# Patient Record
Sex: Female | Born: 1955 | Race: White | Hispanic: No | Marital: Married | Smoking: Never smoker
Health system: Southern US, Community
[De-identification: ages and names within clinical notes are randomized; demographics above are authoritative.]

## PROBLEM LIST (undated history)

## (undated) DIAGNOSIS — F419 Anxiety disorder, unspecified: Secondary | ICD-10-CM

## (undated) DIAGNOSIS — N390 Urinary tract infection, site not specified: Secondary | ICD-10-CM

## (undated) DIAGNOSIS — I10 Essential (primary) hypertension: Secondary | ICD-10-CM

## (undated) DIAGNOSIS — G44219 Episodic tension-type headache, not intractable: Secondary | ICD-10-CM

## (undated) DIAGNOSIS — M169 Osteoarthritis of hip, unspecified: Secondary | ICD-10-CM

## (undated) HISTORY — PX: APPENDECTOMY: SHX54

## (undated) HISTORY — DX: Osteoarthritis of hip, unspecified: M16.9

## (undated) HISTORY — DX: Episodic tension-type headache, not intractable: G44.219

## (undated) HISTORY — PX: BREAST CYST ASPIRATION: SHX578

## (undated) HISTORY — DX: Essential (primary) hypertension: I10

## (undated) HISTORY — DX: Urinary tract infection, site not specified: N39.0

## (undated) HISTORY — DX: Anxiety disorder, unspecified: F41.9

---
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
## 2007-03-27 ENCOUNTER — Ambulatory Visit: Payer: Self-pay | Admitting: Obstetrics and Gynecology

## 2007-05-07 ENCOUNTER — Ambulatory Visit: Payer: Self-pay | Admitting: Gastroenterology

## 2007-05-28 ENCOUNTER — Other Ambulatory Visit: Payer: Self-pay

## 2007-05-28 ENCOUNTER — Inpatient Hospital Stay: Payer: Self-pay | Admitting: Internal Medicine

## 2007-05-29 ENCOUNTER — Other Ambulatory Visit: Payer: Self-pay

## 2007-07-05 LAB — HM COLONOSCOPY: HM Colonoscopy: NORMAL

## 2007-12-19 ENCOUNTER — Ambulatory Visit: Payer: Self-pay | Admitting: Obstetrics and Gynecology

## 2008-05-07 ENCOUNTER — Ambulatory Visit: Payer: Self-pay | Admitting: Obstetrics and Gynecology

## 2009-11-28 HISTORY — PX: BREAST CYST ASPIRATION: SHX578

## 2010-07-04 LAB — HM MAMMOGRAPHY: HM Mammogram: NORMAL

## 2010-09-01 ENCOUNTER — Ambulatory Visit: Payer: Self-pay | Admitting: Internal Medicine

## 2012-05-07 ENCOUNTER — Emergency Department: Payer: Self-pay | Admitting: Emergency Medicine

## 2012-07-04 ENCOUNTER — Other Ambulatory Visit (HOSPITAL_COMMUNITY)
Admission: RE | Admit: 2012-07-04 | Discharge: 2012-07-04 | Disposition: A | Discharge status: Home / Self Care | Payer: PRIVATE HEALTH INSURANCE | Source: Ambulatory Visit | Attending: Internal Medicine | Admitting: Internal Medicine

## 2012-07-04 ENCOUNTER — Ambulatory Visit (INDEPENDENT_AMBULATORY_CARE_PROVIDER_SITE_OTHER): Payer: PRIVATE HEALTH INSURANCE | Admitting: Internal Medicine

## 2012-07-04 ENCOUNTER — Ambulatory Visit: Payer: Self-pay | Admitting: Internal Medicine

## 2012-07-04 ENCOUNTER — Telehealth: Payer: Self-pay | Admitting: Internal Medicine

## 2012-07-04 ENCOUNTER — Encounter: Payer: Self-pay | Admitting: Internal Medicine

## 2012-07-04 VITALS — BP 138/70 | HR 90 | Temp 98.0°F | Resp 16 | Ht 62.75 in | Wt 200.2 lb

## 2012-07-04 DIAGNOSIS — Z0000 Encounter for general adult medical examination without abnormal findings: Secondary | ICD-10-CM

## 2012-07-04 DIAGNOSIS — G44219 Episodic tension-type headache, not intractable: Secondary | ICD-10-CM

## 2012-07-04 DIAGNOSIS — Z124 Encounter for screening for malignant neoplasm of cervix: Secondary | ICD-10-CM

## 2012-07-04 DIAGNOSIS — M25522 Pain in left elbow: Secondary | ICD-10-CM

## 2012-07-04 DIAGNOSIS — Z01419 Encounter for gynecological examination (general) (routine) without abnormal findings: Secondary | ICD-10-CM | POA: Insufficient documentation

## 2012-07-04 DIAGNOSIS — M25529 Pain in unspecified elbow: Secondary | ICD-10-CM

## 2012-07-04 DIAGNOSIS — Z1151 Encounter for screening for human papillomavirus (HPV): Secondary | ICD-10-CM | POA: Insufficient documentation

## 2012-07-04 DIAGNOSIS — N390 Urinary tract infection, site not specified: Secondary | ICD-10-CM

## 2012-07-04 DIAGNOSIS — R030 Elevated blood-pressure reading, without diagnosis of hypertension: Secondary | ICD-10-CM

## 2012-07-04 DIAGNOSIS — I10 Essential (primary) hypertension: Secondary | ICD-10-CM

## 2012-07-04 LAB — MICROALBUMIN / CREATININE URINE RATIO: Microalb, Ur: 0.6 mg/dL (ref 0.0–1.9)

## 2012-07-04 NOTE — Patient Instructions (Addendum)
Return for fasting labs (make appt  With front desk )   Tonya Hopkins will try to get our ultrasound scheduled for this morning.  We will call you with the results of the labs you will get done

## 2012-07-04 NOTE — Progress Notes (Signed)
Patient ID: ARLAYNE LIGGINS, female   DOB: Jul 27, 1956, 56 y.o.   MRN: [id number]  Patient Active Problem List  Diagnosis  . Hypertension  . Recurrent urinary tract infection  . Headache, frequent episodic tension-type  . Pain, joint, upper arm, left    Subjective:  CC:   Chief Complaint  Patient presents with  . New Patient    HPI:   TYTIANA COLES is a 56 y.o. female who presents as a new patient to establish primary care with the chief complaint of Left elbow pain x 1 month,  No history of trauma but carries grocery bags with that arm.  Has tried OTC NSAIDs including alleve helps for a little while.  She takes it once daily at bedtime. She has no history of bruising or bleeding but notes that her left arm is larger from the elbow to the wrist compared to right.  No history of DVT .  No recent iv or blood draws. Her last episode of medical care was treatment for a case of shingles involving toes and dorsum of left foot which occurred in  June.  Very painful for 2 to 3 weeks,  Still has numbness in toes.  She is also requesting that her annual Pap smear were done today.   Past Medical History  Diagnosis Date  . Hypertension     without treatment  . Recurrent urinary tract infection   . Headache, frequent episodic tension-type     Past Surgical History  Procedure Date  . Appendectomy   . Tonsillectomy and adenoidectomy 1971    Family History  Problem Relation Age of Onset  . Osteoporosis Mother   . Heart disease Father   . Hypertension Father   . Osteoporosis Sister   . Hypertension Brother   . Diabetes Brother   . Stroke Brother 64    History   Social History  . Marital Status: Married    Spouse Name: N/A    Number of Children: N/A  . Years of Education: N/A   Occupational History  . Not on file.   Social History Main Topics  . Smoking status: Never Smoker   . Smokeless tobacco: Never Used  . Alcohol Use: No  . Drug Use: No  . Sexually Active: Not on file    Other Topics Concern  . Not on file   Social History Narrative  . No narrative on file    No Known Allergies   Review of Systems:  Review of Systems  Constitutional: Negative.   Eyes: Negative.   Respiratory: Negative.   Cardiovascular: Negative.   Gastrointestinal: Negative.   Genitourinary: Negative.   Musculoskeletal: Positive for joint pain.  Skin: Negative.   Neurological: Negative.  Negative for headaches.  Endo/Heme/Allergies: Negative.   Psychiatric/Behavioral: Negative.   A comprehensive ROS was done and positive for joint pain .   The rest was negative.    Objective:  BP 138/70  Pulse 90  Temp 98 F (36.7 C) (Oral)  Resp 16  Ht 5' 2.75" (1.594 m)  Wt 200 lb 4 oz (90.833 kg)  BMI 35.76 kg/m2  SpO2 96%  General appearance: alert, cooperative and appears stated age Ears: normal TM's and external ear canals both ears Throat: lips, mucosa, and tongue normal; teeth and gums normal Neck: no adenopathy, no carotid bruit, supple, symmetrical, trachea midline and thyroid not enlarged, symmetric, no tenderness/mass/nodules Back: symmetric, no curvature. ROM normal. No CVA tenderness. Lungs: clear to auscultation bilaterally Heart: regular  rate and rhythm, S1, S2 normal, no murmur, click, rub or gallop Abdomen: soft, non-tender; bowel sounds normal; no masses,  no organomegaly Pulses: 2+ and symmetric Skin: Skin color, texture, turgor normal. No rashes or lesions Lymph nodes: Cervical, supraclavicular, and axillary nodes normal.  Assessment and Plan:   Headache, frequent episodic tension-type S. When necessary Aleve.  Recurrent urinary tract infection She has not had a UTI in the past year.  Hypertension Her home blood pressures pressures are borderline. I have ordered a urinary microalbumin to creatinine ratio which was normal. Repeat blood pressure today was confirmed and normal. No medications needed.  Pain, joint, upper arm, left I sent her for  an ultrasound of the left arm to rule out a DVT given the asymmetry noted on exam. It was negative. Her pain appears to be coming from mild tendinitis caused by overdistention of the arm from carrying heavy groceries.. I recommended increasing the Aleve to twice daily. I have recommended that she switch arms and possibly use a cart to carry her groceries behind her.  Screening for cervical cancer Pap smear was done today. Her last one was in 2011 and normal. Pelvic exam was normal.  Routine general medical examination at a health care facility Patient requested annual exam to be good to be done today as she has no chronic illnesses that require followup.   Updated Medication List Outpatient Encounter Prescriptions as of 07/04/2012  Medication Sig Dispense Refill  . Naproxen Sodium (ALEVE PO) Take by mouth.

## 2012-07-04 NOTE — Telephone Encounter (Signed)
Ultrasound indicated negative DVT in patients left arm.

## 2012-07-05 ENCOUNTER — Encounter: Payer: Self-pay | Admitting: Internal Medicine

## 2012-07-05 DIAGNOSIS — Z0000 Encounter for general adult medical examination without abnormal findings: Secondary | ICD-10-CM | POA: Insufficient documentation

## 2012-07-05 DIAGNOSIS — G44219 Episodic tension-type headache, not intractable: Secondary | ICD-10-CM | POA: Insufficient documentation

## 2012-07-05 DIAGNOSIS — N390 Urinary tract infection, site not specified: Secondary | ICD-10-CM | POA: Insufficient documentation

## 2012-07-05 DIAGNOSIS — Z124 Encounter for screening for malignant neoplasm of cervix: Secondary | ICD-10-CM | POA: Insufficient documentation

## 2012-07-05 DIAGNOSIS — I10 Essential (primary) hypertension: Secondary | ICD-10-CM | POA: Insufficient documentation

## 2012-07-05 DIAGNOSIS — M25522 Pain in left elbow: Secondary | ICD-10-CM | POA: Insufficient documentation

## 2012-07-05 NOTE — Assessment & Plan Note (Signed)
Pap smear was done today. Her last one was in 2011 and normal. Pelvic exam was normal.

## 2012-07-05 NOTE — Assessment & Plan Note (Signed)
I sent her for an ultrasound of the left arm to rule out a DVT given the asymmetry noted on exam. It was negative. Her pain appears to be coming from mild tendinitis caused by overdistention of the arm from carrying heavy groceries.. I recommended increasing the Aleve to twice daily. I have recommended that she switch arms and possibly use a cart to carry her groceries behind her.

## 2012-07-05 NOTE — Assessment & Plan Note (Signed)
She has not had a UTI in the past year.

## 2012-07-05 NOTE — Assessment & Plan Note (Addendum)
Her home blood pressures pressures are borderline. I have ordered a urinary microalbumin to creatinine ratio which was normal. Repeat blood pressure today was confirmed and normal. No medications needed.

## 2012-07-05 NOTE — Assessment & Plan Note (Signed)
Patient requested annual exam to be good to be done today as she has no chronic illnesses that require followup.

## 2012-07-05 NOTE — Assessment & Plan Note (Signed)
S. When necessary Aleve.

## 2012-07-20 ENCOUNTER — Encounter: Payer: Self-pay | Admitting: Internal Medicine

## 2012-07-20 ENCOUNTER — Other Ambulatory Visit: Payer: PRIVATE HEALTH INSURANCE

## 2012-07-24 ENCOUNTER — Telehealth: Payer: Self-pay | Admitting: *Deleted

## 2012-07-24 ENCOUNTER — Other Ambulatory Visit (INDEPENDENT_AMBULATORY_CARE_PROVIDER_SITE_OTHER): Payer: PRIVATE HEALTH INSURANCE

## 2012-07-24 DIAGNOSIS — Z0000 Encounter for general adult medical examination without abnormal findings: Secondary | ICD-10-CM

## 2012-07-24 LAB — CBC WITH DIFFERENTIAL/PLATELET
Eosinophils Relative: 1.3 % (ref 0.0–5.0)
HCT: 44.3 % (ref 36.0–46.0)
Hemoglobin: 14.8 g/dL (ref 12.0–15.0)
Lymphocytes Relative: 34.8 % (ref 12.0–46.0)
Lymphs Abs: 1.9 10*3/uL (ref 0.7–4.0)
Monocytes Relative: 6.5 % (ref 3.0–12.0)
Neutro Abs: 3 10*3/uL (ref 1.4–7.7)
Platelets: 179 10*3/uL (ref 150.0–400.0)
WBC: 5.3 10*3/uL (ref 4.5–10.5)

## 2012-07-24 LAB — COMPREHENSIVE METABOLIC PANEL
Alkaline Phosphatase: 46 U/L (ref 39–117)
BUN: 17 mg/dL (ref 6–23)
Creatinine, Ser: 0.9 mg/dL (ref 0.4–1.2)
Glucose, Bld: 85 mg/dL (ref 70–99)
Total Bilirubin: 0.8 mg/dL (ref 0.3–1.2)

## 2012-07-24 LAB — LIPID PANEL
Cholesterol: 201 mg/dL — ABNORMAL HIGH (ref 0–200)
HDL: 52.9 mg/dL (ref >39.00)
Total CHOL/HDL Ratio: 4
VLDL: 19.2 mg/dL (ref 0.0–40.0)

## 2012-07-24 NOTE — Telephone Encounter (Signed)
Fasting lipids,  TSH,  CMET and CBC w diff

## 2012-07-24 NOTE — Telephone Encounter (Signed)
Patient came in for labs today, what would you like ordered?

## 2012-07-24 NOTE — Telephone Encounter (Signed)
Labs ordered.

## 2012-09-07 ENCOUNTER — Telehealth: Payer: Self-pay | Admitting: Internal Medicine

## 2012-09-07 DIAGNOSIS — Z1239 Encounter for other screening for malignant neoplasm of breast: Secondary | ICD-10-CM

## 2012-09-07 NOTE — Telephone Encounter (Signed)
Patient is aware of mammogram appointment at [facility] on October 29,2013 @ 6:00 pm.  I need an order to fax to [place].

## 2012-09-10 NOTE — Telephone Encounter (Signed)
Ordered and printed 

## 2012-09-10 NOTE — Telephone Encounter (Signed)
Can we please get an order. Thank you

## 2012-09-11 NOTE — Telephone Encounter (Signed)
I have printed this order and waiting for Dr.Tullo to sign so I can fax.

## 2012-09-14 NOTE — Telephone Encounter (Signed)
This was faxed on 09/10/12 and in blue folder for one month.

## 2012-09-25 ENCOUNTER — Ambulatory Visit: Payer: Self-pay | Admitting: Internal Medicine

## 2012-10-05 ENCOUNTER — Encounter: Payer: Self-pay | Admitting: Internal Medicine

## 2012-11-14 ENCOUNTER — Encounter: Payer: Self-pay | Admitting: Internal Medicine

## 2012-11-14 ENCOUNTER — Telehealth: Payer: Self-pay | Admitting: Internal Medicine

## 2012-11-14 ENCOUNTER — Ambulatory Visit (INDEPENDENT_AMBULATORY_CARE_PROVIDER_SITE_OTHER): Payer: PRIVATE HEALTH INSURANCE | Admitting: Internal Medicine

## 2012-11-14 VITALS — BP 130/90 | HR 89 | Temp 98.1°F | Resp 16 | Wt 204.2 lb

## 2012-11-14 DIAGNOSIS — J40 Bronchitis, not specified as acute or chronic: Secondary | ICD-10-CM

## 2012-11-14 DIAGNOSIS — H6690 Otitis media, unspecified, unspecified ear: Secondary | ICD-10-CM

## 2012-11-14 DIAGNOSIS — H6691 Otitis media, unspecified, right ear: Secondary | ICD-10-CM | POA: Insufficient documentation

## 2012-11-14 MED ORDER — BENZONATATE 200 MG PO CAPS: 200.0000 mg | ORAL_CAPSULE | Freq: Two times a day (BID) | ORAL | Status: DC | PRN

## 2012-11-14 MED ORDER — HYDROCOD POLST-CHLORPHEN POLST 10-8 MG/5ML PO LQCR: 5.0000 mL | Freq: Two times a day (BID) | ORAL | Status: DC | PRN

## 2012-11-14 MED ORDER — AZITHROMYCIN 250 MG PO TABS: ORAL_TABLET | ORAL | Status: DC

## 2012-11-14 NOTE — Assessment & Plan Note (Signed)
Symptoms are most consistent with bronchitis. Will treat with azithromycin. Will use Tessalon for cough during the day and Tussionex at night. Patient will call if symptoms are persistent or worsening of the next 48 hours. We discussed, however, that she may have some lingering dry cough over the next few weeks. She will e-mail with an update later this week.

## 2012-11-14 NOTE — Progress Notes (Signed)
Subjective:    Patient ID: Tonya Hopkins, female    DOB: Apr 19, 1956, 56 y.o.   MRN: [id number]  HPI 66 rolled female with history of hypertension presents for acute visit complaining of one-week history of cough, hoarseness, nasal congestion, postnasal drip, ear pain. She has some productive cough with yellow, occasionally blood-tinged sputum. She has also noticed some bleeding from her right nostril. She denies any fever or chills. She occasionally has some mild shortness of breath. She denies chest pain. She has been taking Robitussin-DM with minimal improvement in her symptoms.  Outpatient Encounter Prescriptions as of 11/14/2012  Medication Sig Dispense Refill  . Naproxen Sodium (ALEVE PO) Take by mouth.      Marland Kitchen azithromycin (ZITHROMAX Z-PAK) 250 MG tablet Take 2 pills day 1, then 1 pill daily days 2-5  6 each  0  . benzonatate (TESSALON) 200 MG capsule Take 1 capsule (200 mg total) by mouth 2 (two) times daily as needed for cough.  20 capsule  0  . chlorpheniramine-HYDROcodone (TUSSIONEX) 10-8 MG/5ML LQCR Take 5 mLs by mouth every 12 (twelve) hours as needed.  140 mL  0   BP 130/90  Pulse 89  Temp 98.1 F (36.7 C) (Oral)  Resp 16  Wt 204 lb 4 oz (92.647 kg)  SpO2 97%  Review of Systems  Constitutional: Positive for fatigue. Negative for fever, chills, appetite change and unexpected weight change.  HENT: Positive for ear pain, congestion, sore throat, rhinorrhea, voice change and postnasal drip. Negative for trouble swallowing, neck pain and sinus pressure.   Eyes: Negative for visual disturbance.  Respiratory: Positive for cough and shortness of breath. Negative for wheezing and stridor.   Cardiovascular: Negative for chest pain, palpitations and leg swelling.  Gastrointestinal: Negative for nausea, vomiting, abdominal pain, diarrhea, constipation, blood in stool, abdominal distention and anal bleeding.  Genitourinary: Negative for dysuria and flank pain.  Musculoskeletal: Negative  for myalgias, arthralgias and gait problem.  Skin: Negative for color change and rash.  Neurological: Negative for dizziness and headaches.  Hematological: Negative for adenopathy. Does not bruise/bleed easily.  Psychiatric/Behavioral: Negative for suicidal ideas, sleep disturbance and dysphoric mood. The patient is not nervous/anxious.        Objective:   Physical Exam  Constitutional: She is oriented to person, place, and time. She appears well-developed and well-nourished. No distress.  HENT:  Head: Normocephalic and atraumatic.  Right Ear: External ear normal. Tympanic membrane is erythematous and bulging. A middle ear effusion is present.  Left Ear: Tympanic membrane, external ear and ear canal normal.  Nose: Nose normal.  Mouth/Throat: Posterior oropharyngeal erythema present. No oropharyngeal exudate.  Eyes: Conjunctivae normal are normal. Pupils are equal, round, and reactive to light. Right eye exhibits no discharge. Left eye exhibits no discharge. No scleral icterus.  Neck: Normal range of motion. Neck supple. No tracheal deviation present. No thyromegaly present.  Cardiovascular: Normal rate, regular rhythm, normal heart sounds and intact distal pulses.  Exam reveals no gallop and no friction rub.   No murmur heard. Pulmonary/Chest: Effort normal. No accessory muscle usage. Not tachypneic. No respiratory distress. She has no decreased breath sounds. She has no wheezes. Rhonchi: scattered. She has no rales. She exhibits no tenderness.  Musculoskeletal: Normal range of motion. She exhibits no edema and no tenderness.  Lymphadenopathy:    She has no cervical adenopathy.  Neurological: She is alert and oriented to person, place, and time. No cranial nerve deficit. She exhibits normal muscle tone. Coordination normal.  Skin: Skin is warm and dry. No rash noted. She is not diaphoretic. No erythema. No pallor.  Psychiatric: She has a normal mood and affect. Her behavior is normal.  Judgment and thought content normal.          Assessment & Plan:

## 2012-11-14 NOTE — Telephone Encounter (Signed)
Patient Information:  Caller Name: Alexiah  Phone: [phone]  Patient: Tonya Hopkins, Meriweather  Gender: Female  DOB: Feb 25, 1956  Age: 56 Years  PCP: Duncan Dull (Adults only)  Office Follow Up:  Does the office need to follow up with this patient?: No  Instructions For The Office: N/A  RN Note:  States she is having severe coughing spells.  Denies difficulty breathing or wheezing.  Symptoms  Reason For Call & Symptoms: Cough, right earache  Reviewed Health History In EMR: Yes  Reviewed Medications In EMR: Yes  Reviewed Allergies In EMR: Yes  Reviewed Surgeries / Procedures: Yes  Date of Onset of Symptoms: 11/08/2012  Treatments Tried: Alka Seltzer Cold Plus, Robitussin DM  Treatments Tried Worked: Yes  Guideline(s) Used:  Cough  Disposition Per Guideline:   See Today in Office  Reason For Disposition Reached:   Severe coughing spells (e.g., whooping sound after coughing, vomiting after coughing)  Advice Given:  N/A  Appointment Scheduled:  11/14/2012 15:45:00 Appointment Scheduled Provider:  Ronna Polio (Adults only)

## 2012-11-14 NOTE — Assessment & Plan Note (Signed)
Symptoms are consistent with bronchitis and exam is remarkable for right otitis media. As above, will treat with azithromycin. Patient will call if symptoms are not improving.

## 2013-01-01 ENCOUNTER — Telehealth: Payer: Self-pay | Admitting: Internal Medicine

## 2013-01-01 ENCOUNTER — Ambulatory Visit: Payer: Self-pay | Admitting: Internal Medicine

## 2013-01-01 NOTE — Telephone Encounter (Signed)
Patient Information:  Caller Name: Shalay [phone]  Phone: [phone]  Patient: Tonya Hopkins, Tonya Hopkins  Gender: Female  DOB: [birth year]  Age: 57 Years  PCP: Duncan Dull (Adults only)  Office Follow Up:  Does the office need to follow up with this patient?: No  Instructions For The Office: N/A  RN Note:  Monitoring BP with own cuff with BPs 135/95,  155/105.  140/87, since Sunday.  Since Sunday feels foggy and dizzy.  Eyes had hard time focusing on Sunday but has resolved.  Also headache since through this period rates as 7-8/10 pain scale.  Nothing is helping headache.  Hands have been swollen and also complains of vague blurry vision, but prefers office to urgent care or ED.  Appointment scheduled today at 16:15  Symptoms  Reason For Call & Symptoms: Has headache and dizziness since 02/02  Reviewed Health History In EMR: Yes  Reviewed Medications In EMR: Yes  Reviewed Allergies In EMR: Yes  Reviewed Surgeries / Procedures: Yes  Date of Onset of Symptoms: 12/30/2012  Guideline(s) Used:  High Blood Pressure  Disposition Per Guideline:   See Today in Office  Reason For Disposition Reached:   Patient wants to be seen  Advice Given:  N/A  Appointment Scheduled:  01/01/2013 16:15:00 Appointment Scheduled Provider:  Dale Lazar

## 2013-01-01 NOTE — Telephone Encounter (Signed)
Spoke  With patient and explained that as appointment was scheduled for today was unaware that it was being closed for the remainder of the day for provider.  Discussed options with patient.  RN remains concerned with the multiple symptoms patient has had since 12/30/12 mors so than BP readings. Brother has had multiple strokes.  Patient will go to UC to have BP checked versus home device.  She will follow up with office on 01/02/13 at 08:00 with Dr. Patrice Paradise.

## 2013-01-02 ENCOUNTER — Telehealth: Payer: Self-pay | Admitting: Internal Medicine

## 2013-01-02 ENCOUNTER — Ambulatory Visit: Payer: PRIVATE HEALTH INSURANCE | Admitting: Internal Medicine

## 2013-01-02 ENCOUNTER — Ambulatory Visit (INDEPENDENT_AMBULATORY_CARE_PROVIDER_SITE_OTHER): Payer: PRIVATE HEALTH INSURANCE | Admitting: Internal Medicine

## 2013-01-02 ENCOUNTER — Encounter: Payer: Self-pay | Admitting: Internal Medicine

## 2013-01-02 VITALS — BP 144/92 | HR 87 | Temp 98.5°F | Resp 16 | Wt 202.8 lb

## 2013-01-02 DIAGNOSIS — E785 Hyperlipidemia, unspecified: Secondary | ICD-10-CM

## 2013-01-02 DIAGNOSIS — R5383 Other fatigue: Secondary | ICD-10-CM

## 2013-01-02 DIAGNOSIS — E669 Obesity, unspecified: Secondary | ICD-10-CM

## 2013-01-02 DIAGNOSIS — R5381 Other malaise: Secondary | ICD-10-CM

## 2013-01-02 DIAGNOSIS — I10 Essential (primary) hypertension: Secondary | ICD-10-CM

## 2013-01-02 MED ORDER — HYDROCHLOROTHIAZIDE 25 MG PO TABS: 25.0000 mg | ORAL_TABLET | Freq: Every day | ORAL | Status: DC

## 2013-01-02 MED ORDER — ZOLPIDEM TARTRATE 5 MG PO TABS: 5.0000 mg | ORAL_TABLET | Freq: Every evening | ORAL | Status: DC | PRN

## 2013-01-02 MED ORDER — OMEPRAZOLE 20 MG PO CPDR: 20.0000 mg | DELAYED_RELEASE_CAPSULE | Freq: Every day | ORAL | Status: AC

## 2013-01-02 NOTE — Patient Instructions (Addendum)
We will schedule a sleep study test   Resume the hctz for blood pressure and return in 2 weeks for fasting bloodwork   Resume allegra daily for allergies to see if the cough resolves  Take omeprazole first thin in the morning   Try 25 mg benadryl at night if cough still present at night   i will send rx for generic ambien to your pharmacy  (try first at home)  i recommend an annual eye exam.

## 2013-01-02 NOTE — Assessment & Plan Note (Addendum)
She has had borderline blood pressures in the past with a negative workup for proteinuria and renal insufficiency. Her current elevated readings may be due to frequent use of Aleve or they may be secondary to untreated sleep apnea. I have recommended that she start the hydrochlorothiazide 25 mg daily and have her return in 2 weeks for BMET .  Referral for sleep study test. I will remind her to limit her use of Aleve and use tramadol as needed for pain so we can sort out the secondary and primary causes here. EKG was normal.

## 2013-01-02 NOTE — Progress Notes (Signed)
Patient ID: Tonya Hopkins, female   DOB: 1956/05/21, 57 y.o.   MRN: [id number]   Patient Active Problem List  Diagnosis  . Hypertension  . Headache, frequent episodic tension-type  . Screening for cervical cancer  . Routine general medical examination at a health care facility    Subjective:  CC:   Chief Complaint  Patient presents with  . Elevated BP    HPI:   Tonya Sawchuk Durhamis a 57 y.o. female who presents Uncontrolled hypertension.  She has a history of borderline hypertension which was evaluated last August with normal urine creatinine and microalbumin ratio and normal basic metabolic panel. She has a history of frequent episodic type headaches and uses Aleve at least 3 times per week. She has been noting Elevated BPs associated with headache and visual changes since Sunday. Patient has been taking her blood pressure hourly since Sunday and and she was treated at [facility] and evaluated with an EKG and a basic metabolic panel. The EKG was normal with the exception of a Q wave in lead 3 only and possible biatrial enlargement with no signs of LV hypertrophy. Basic metabolic panel is notable for potassium 4.1 and a creatinine of 0.8. She has had significant emotional stressors over the last several weeks. Her mother died on Dec 13, 2022 acetaminophen nursing home. The loss of her mother, along with the death of one of her dogs and the rece h relocation of her mother-in-law into a nursing home have all been cited as contributors. She is having a lot of tearfulness and frequent crying spells. She is also been told that she snores and is waking up frequently. She feels tired when she wakes up and notes excessive daytime fatigue and sleepiness. She has no no prior evaluation for sleep apnea .    Past Medical History  Diagnosis Date  . Hypertension     without treatment  . Recurrent urinary tract infection   . Headache, frequent episodic tension-type     Past  Surgical History  Procedure Date  . Appendectomy   . Tonsillectomy and adenoidectomy 1971         The following portions of the patient's history were reviewed and updated as appropriate: Allergies, current medications, and problem list.    Review of Systems:  Patient denies fevers, malaise, unintentional weight loss, skin rash, eye pain, sinus congestion and sinus pain, sore throat, dysphagia,  hemoptysis , cough, dyspnea, wheezing, chest pain, palpitations, orthopnea, edema, abdominal pain, nausea, melena, diarrhea, constipation, flank pain, dysuria, hematuria, urinary  Frequency, nocturia, numbness, tingling, seizures,  Focal weakness, Loss of consciousness,  Tremor, insomnia, depression, anxiety, and suicidal ideation.     History   Social History  . Marital Status: Married    Spouse Name: N/A    Number of Children: N/A  . Years of Education: N/A   Occupational History  . Not on file.   Social History Main Topics  . Smoking status: Never Smoker   . Smokeless tobacco: Never Used  . Alcohol Use: No  . Drug Use: No  . Sexually Active: Not on file   Other Topics Concern  . Not on file   Social History Narrative  . No narrative on file    Objective:  BP 144/92  Pulse 87  Temp 98.5 F (36.9 C) (Oral)  Resp 16  Wt 202 lb 12 oz (91.967 kg)  SpO2 95%  General appearance: alert, cooperative and appears stated age Back: symmetric,  no curvature. ROM normal. No CVA tenderness. Lungs: clear to auscultation bilaterally Heart: regular rate and rhythm, S1, S2 normal, no murmur, click, rub or gallop Abdomen: soft, non-tender; bowel sounds normal; no masses,  no organomegaly Pulses: 2+ and symmetric Skin: Skin color, texture, turgor normal. No rashes or lesions Lymph nodes: Cervical, supraclavicular, and axillary nodes normal.  Assessment and Plan:  Hypertension She has had borderline blood pressures in the past with a negative workup for proteinuria and renal  insufficiency. Her current elevated readings may be due to frequent use of Aleve or they may be secondary to untreated sleep apnea. I have recommended that she start the hydrochlorothiazide 25 mg daily and have her return in 2 weeks for BMET .  Referral for sleep study test. I will remind her to limit her use of Aleve and use tramadol as needed for pain so we can sort out the secondary and primary causes here. EKG was normal.   Updated Medication List Outpatient Encounter Prescriptions as of 01/02/2013  Medication Sig Dispense Refill  . hydrochlorothiazide (HYDRODIURIL) 25 MG tablet Take 1 tablet (25 mg total) by mouth daily.  90 tablet  3  . omeprazole (PRILOSEC) 20 MG capsule Take 1 capsule (20 mg total) by mouth daily.  30 capsule  3  . zolpidem (AMBIEN) 5 MG tablet Take 1 tablet (5 mg total) by mouth at bedtime as needed for sleep.  15 tablet  1  . [DISCONTINUED] azithromycin (ZITHROMAX Z-PAK) 250 MG tablet Take 2 pills day 1, then 1 pill daily days 2-5  6 each  0  . [DISCONTINUED] benzonatate (TESSALON) 200 MG capsule Take 1 capsule (200 mg total) by mouth 2 (two) times daily as needed for cough.  20 capsule  0  . [DISCONTINUED] chlorpheniramine-HYDROcodone (TUSSIONEX) 10-8 MG/5ML LQCR Take 5 mLs by mouth every 12 (twelve) hours as needed.  140 mL  0  . [DISCONTINUED] Naproxen Sodium (ALEVE PO) Take by mouth.         Orders Placed This Encounter  Procedures  . Comprehensive metabolic panel  . TSH  . CBC with Differential  . Lipid panel  . Ambulatory referral to Sleep Studies    No Follow-up on file.

## 2013-01-02 NOTE — Telephone Encounter (Signed)
On thing I did not ask Tonya Hopkins about today : has she been using Aleve or Motrin the last several days?  If she has ,she needs to stop, as  they could be elevating her blood pressure.  If she needs an alternative, I can prescribe tramadol

## 2013-01-03 MED ORDER — TRAMADOL HCL 50 MG PO TABS: ORAL_TABLET | ORAL | Status: AC

## 2013-01-03 NOTE — Telephone Encounter (Signed)
rx sent

## 2013-01-03 NOTE — Telephone Encounter (Signed)
Pt called states she has been using two pills every night before bed. States it is ok for the Tramadol pt uses [employer].

## 2013-01-14 ENCOUNTER — Ambulatory Visit: Payer: PRIVATE HEALTH INSURANCE | Admitting: Internal Medicine

## 2013-01-17 ENCOUNTER — Encounter: Payer: Self-pay | Admitting: Internal Medicine

## 2013-01-17 ENCOUNTER — Ambulatory Visit (INDEPENDENT_AMBULATORY_CARE_PROVIDER_SITE_OTHER): Payer: PRIVATE HEALTH INSURANCE | Admitting: Internal Medicine

## 2013-01-17 VITALS — BP 128/74 | HR 83 | Temp 97.6°F | Resp 16 | Wt 200.8 lb

## 2013-01-17 DIAGNOSIS — R5383 Other fatigue: Secondary | ICD-10-CM

## 2013-01-17 DIAGNOSIS — K625 Hemorrhage of anus and rectum: Secondary | ICD-10-CM

## 2013-01-17 LAB — COMPREHENSIVE METABOLIC PANEL
ALT: 25 U/L (ref 0–35)
AST: 21 U/L (ref 0–37)
Albumin: 4.2 g/dL (ref 3.5–5.2)
Alkaline Phosphatase: 52 U/L (ref 39–117)
Calcium: 9.7 mg/dL (ref 8.4–10.5)
Chloride: 103 mEq/L (ref 96–112)
Potassium: 3.7 mEq/L (ref 3.5–5.1)
Sodium: 139 mEq/L (ref 135–145)

## 2013-01-17 LAB — CBC WITH DIFFERENTIAL/PLATELET
Basophils Relative: 0.6 % (ref 0.0–3.0)
Eosinophils Absolute: 0.1 10*3/uL (ref 0.0–0.7)
HCT: 43.6 % (ref 36.0–46.0)
Lymphs Abs: 2.2 10*3/uL (ref 0.7–4.0)
MCHC: 34.1 g/dL (ref 30.0–36.0)
MCV: 88.3 fl (ref 78.0–100.0)
Monocytes Absolute: 0.4 10*3/uL (ref 0.1–1.0)
Neutrophils Relative %: 57.3 % (ref 43.0–77.0)
Platelets: 201 10*3/uL (ref 150.0–400.0)

## 2013-01-17 LAB — LDL CHOLESTEROL, DIRECT: Direct LDL: 127.5 mg/dL

## 2013-01-17 LAB — LIPID PANEL
HDL: 47 mg/dL (ref >39.00)
Triglycerides: 114 mg/dL (ref 0.0–149.0)
VLDL: 22.8 mg/dL (ref 0.0–40.0)

## 2013-01-17 MED ORDER — ESCITALOPRAM OXALATE 5 MG PO TABS: 5.0000 mg | ORAL_TABLET | Freq: Every day | ORAL | Status: AC

## 2013-01-17 NOTE — Patient Instructions (Addendum)
Ok to resume Aleve as needed,  But if BP 1 hour after taking it is > 150  We will need to stop it  Try the high fiber diet below for the hemorrhoid .  If it begins to bleed continually we will refer you to Sharlot Gowda in epsom salts if hemorrhoid gets swollen and painful and  Call for an ointment   You need to lose 10%  Of your current body weight over the next 6 months   This is  my version of a  "Low GI"  Diet:  It is not ultra low carb, but will still lower your blood sugars and allow you to lose 5 to 10 lbs per month if you follow it carefully. All of the foods can be found at grocery stores and in bulk at [employer].  The Atkins protein bars and shakes are available in more varieties at [employer], [employer] and [employer].  It has a lot of fiber in it bc of the bread choices listed.    7 AM Breakfast:  Low carbohydrate Protein  Shakes (I recommend the EAS AdvantEdge "Carb Control" shakes  Or the low carb shakes by Atkins.   Both are available everywhere:  In  cases at [employer]Js  Or in 4 packs at grocery stores and pharmacies  2.5 carbs  (Alternative is  a toasted Arnold's Sandwhich Thin w/ peanut butter, a "Bagel Thin" with cream cheese and salmon) or  a scrambled egg burrito made with a low carb tortilla .  Avoid cereal and bananas, oatmeal too unless you are cooking the old fashioned kind that takes 30-40 minutes to prepare.  the rest is overly processed, has minimal fiber, and is loaded with carbohydrates!   10 AM: Protein bar by Atkins (the snack size, under 200 cal).  There are many varieties , available widely again or in bulk in limited varieties at BJs)  Other so called "protein bars" tend to be loaded with carbohydrates.  Remember, in food advertising, the word "energy" is synonymous for " carbohydrate."  Lunch: sandwich of [place], (or any lunchmeat, grilled meat or canned tuna), fresh avocado, mayonnaise  and cheese on a lower carbohydrate pita bread, flatbread, or tortilla . Ok to use regular  mayonnaise. The bread is the only source or carbohydrate that can be decreased (Joseph's makes a pita bread and a flat bread that are 50 cal and 4 net carbs ; Toufayan makes a low carb flatbread that's 100 cal and 9 net carbs  and  Mission makes a low carb whole wheat tortilla  That is 210 cal and 6 net carbs)  3 PM:  Mid day :  Another protein bar,  Or a  cheese stick (100 cal, 0 carbs),  Or 1 ounce of  almonds, walnuts, pistachios, pecans, peanuts,  Macadamia nuts. Or a Dannon light n Fit greek yogurt, 80 cal 8 net carbs . Avoid "granola"; the dried cranberries and raisins are loaded with carbohydrates. Mixed nuts ok if no raisins or cranberries or dried fruit.      6 PM  Dinner:  "mean and green:"  Meat/chicken/fish or a high protein legume; , with a green salad, and a low GI  Veggie (broccoli, cauliflower, green beans, spinach, brussel sprouts. Lima beans) : Avoid "Low fat dressings, as well as Reyne Dumas and [address]! They are loaded with sugar! Instead use ranch, vinagrette,  Blue cheese, etc.  There is a low carb pasta by Dreamfield's available  at [facility] grocery that is acceptable and tastes great. Try Michel Angel's chicken piccata over low carb pasta. The chicken dish is 0 carbs, and can be found in frozen section at BJs and [employer]. Also try [employer]" (pulled pork, no sauce,  0 carbs) and his pot roast.   both are in the refrigerated section at BJs   Dreamfield's makes a low carb pasta only 5 g/serving.  Available at all grocery stores,  And tastes like normal pasta  9 PM snack : Breyer's "low carb" fudgsicle or  ice cream bar (Carb Smart line), or  Weight Watcher's ice cream bar , or another "no sugar added" ice cream;a serving of fresh berries/cherries with whipped cream (Avoid bananas, pineapple, grapes  and watermelon on a regular basis because they are high in sugar)   Remember that snack Substitutions should be less than 10 carbs per serving and meals < 20 carbs.  Remember to subtract fiber grams and sugar alcohols to get the "net carbs."

## 2013-01-17 NOTE — Progress Notes (Signed)
Patient ID: Tonya Hopkins, female   DOB: 1955/12/16, 57 y.o.   MRN: [id number]   Patient Active Problem List  Diagnosis  . Hypertension  . Headache, frequent episodic tension-type  . Screening for cervical cancer  . Routine general medical examination at a health care facility  . Anxiety  . OA (osteoarthritis) of hip  . Rectal bleeding  . Obesity, unspecified    Subjective:  CC:   Chief Complaint  Patient presents with  . Follow-up    HPI:   Tonya Biederman Durhamis a 57 y.o. female who presents Follow up on multiple issues raised at the end of last visit along with follow up on new onset hypertension accompanied by headaches and visual changes..  Since she has stopped the daily NSAID and started hctz started,  Her home readings have been consistently < 130/80. Sheis tolerating the medications having fewer headaches. Using tylenol and tramadol prn.  Cites new stressors as negative influences,  Including her sick mother in law and family members of friends being sick,   2) Lateral left foot hurting.  Worse with weight bearing,  No history of fall or twisting incident.,  No re3cent long walks,  No history of bruising noted. Marland Kitchen  3)  3) Left hip hurts when she lies on it,  Left arm goes numb if she lies on it for a prlonged period of time.  Does not go numb during the day or with use of arm. No neck pain   4) Rectal bleeding.   She has been noticing BRB on the toilet paper after a BM,  No blood mixed with stools.  Thinks she has a hemorrhoid  5) History of snoring,  Has not scheduled sleep study that was advised at last visit given new onset hypertension and history of obesity.  Wants to wait .    Past Medical History  Diagnosis Date  . Recurrent urinary tract infection   . Headache, frequent episodic tension-type   . Anxiety   . Hypertension     without treatment  . OA (osteoarthritis) of hip     Past Surgical History  Procedure Laterality Date  . Appendectomy    . Tonsillectomy  and adenoidectomy  1971       The following portions of the patient's history were reviewed and updated as appropriate: Allergies, current medications, and problem list.    Review of Systems:   12 Pt  review of systems was negative except those addressed in the HPI,     History   Social History  . Marital Status: Married    Spouse Name: N/A    Number of Children: N/A  . Years of Education: N/A   Occupational History  . Not on file.   Social History Main Topics  . Smoking status: Never Smoker   . Smokeless tobacco: Never Used  . Alcohol Use: No  . Drug Use: No  . Sexually Active: Not on file   Other Topics Concern  . Not on file   Social History Narrative  . No narrative on file    Objective:  BP 128/74  Pulse 83  Temp(Src) 97.6 F (36.4 C) (Oral)  Resp 16  Wt 200 lb 12 oz (91.06 kg)  BMI 35.84 kg/m2  SpO2 97%  General appearance: alert, cooperative and appears stated age Ears: normal TM's and external ear canals both ears Throat: lips, mucosa, and tongue normal; teeth and gums normal Neck: no adenopathy, no carotid bruit, supple, symmetrical, trachea  midline and thyroid not enlarged, symmetric, no tenderness/mass/nodules Back: symmetric, no curvature. ROM normal. No CVA tenderness. Lungs: clear to auscultation bilaterally Heart: regular rate and rhythm, S1, S2 normal, no murmur, click, rub or gallop Abdomen: soft, non-tender; bowel sounds normal; no masses,  no organomegaly Pulses: 2+ and symmetric Skin: Skin color, texture, turgor normal. No rashes or lesions Lymph nodes: Cervical, supraclavicular, and axillary nodes normal.  Assessment and Plan:  Hypertension Well controlled on current regimen. Renal function stable, no changes today.  Anxiety Managed with lexapro and ambien.  Recommended daily exercise to help manage stress.   Rectal bleeding Secondary to presumed hemorrhoid.  high fiber diet recommended and epsom salt stiz baths prn.    OA (osteoarthritis) of hip Exam normal of both foot and hip,  Reassurance given.  Retrial of alleve and if bp is elevated will need alternative non NSAID  Obesity, unspecified I have addressed  BMI and recommended a low glycemic index diet utilizing smaller more frequent meals to increase metabolism.  I have also recommended that patient start exercising with a goal of 30 minutes of aerobic exercise a minimum of 5 days per week. Screening for lipid disorders, thyroid and diabetes has been done. She will return in 3 months.     Updated Medication List Outpatient Encounter Prescriptions as of 01/17/2013  Medication Sig Dispense Refill  . hydrochlorothiazide (HYDRODIURIL) 25 MG tablet Take 1 tablet (25 mg total) by mouth daily.  90 tablet  3  . omeprazole (PRILOSEC) 20 MG capsule Take 1 capsule (20 mg total) by mouth daily.  30 capsule  3  . traMADol (ULTRAM) 50 MG tablet 1 or 2 tablets every 8 hours as needed for pain  90 tablet  1  . escitalopram (LEXAPRO) 5 MG tablet Take 1 tablet (5 mg total) by mouth daily.  30 tablet  2  . zolpidem (AMBIEN) 5 MG tablet Take 1 tablet (5 mg total) by mouth at bedtime as needed for sleep.  15 tablet  1   No facility-administered encounter medications on file as of 01/17/2013.

## 2013-01-20 ENCOUNTER — Encounter: Payer: Self-pay | Admitting: Internal Medicine

## 2013-01-20 DIAGNOSIS — F419 Anxiety disorder, unspecified: Secondary | ICD-10-CM | POA: Insufficient documentation

## 2013-01-20 NOTE — Assessment & Plan Note (Signed)
Exam normal of both foot and hip,  Reassurance given.  Retrial of alleve and if bp is elevated will need alternative non NSAID

## 2013-01-20 NOTE — Assessment & Plan Note (Signed)
Secondary to presumed hemorrhoid.  high fiber diet recommended and epsom salt stiz baths prn.

## 2013-01-20 NOTE — Assessment & Plan Note (Addendum)
I have addressed  BMI and recommended a low glycemic index diet utilizing smaller more frequent meals to increase metabolism.  I have also recommended that patient start exercising with a goal of 30 minutes of aerobic exercise a minimum of 5 days per week. Screening for lipid disorders, thyroid and diabetes has been done. She will return in 3 months.

## 2013-01-20 NOTE — Assessment & Plan Note (Signed)
Well controlled on current regimen. Renal function stable, no changes today. 

## 2013-01-20 NOTE — Assessment & Plan Note (Signed)
Managed with lexapro and ambien.  Recommended daily exercise to help manage stress.

## 2013-03-13 ENCOUNTER — Telehealth: Payer: Self-pay

## 2013-03-13 ENCOUNTER — Other Ambulatory Visit: Payer: Self-pay

## 2013-03-13 MED ORDER — HYDROCHLOROTHIAZIDE 25 MG PO TABS: 25.0000 mg | ORAL_TABLET | Freq: Every day | ORAL | Status: DC

## 2013-03-13 NOTE — Telephone Encounter (Signed)
Returned pt call she left voicemail about a refill on her medication. Need to know which medication she needs a refill on. Left message for pt to return phone call to office.

## 2013-03-13 NOTE — Telephone Encounter (Signed)
Sent hydrochlochlorothiazide to [facility]

## 2013-05-23 ENCOUNTER — Emergency Department: Payer: Self-pay | Admitting: Emergency Medicine

## 2013-05-23 LAB — BASIC METABOLIC PANEL
Anion Gap: 4 — ABNORMAL LOW (ref 7–16)
Calcium, Total: 10.3 mg/dL — ABNORMAL HIGH (ref 8.5–10.1)
Chloride: 106 mmol/L (ref 98–107)
Creatinine: 0.97 mg/dL (ref 0.60–1.30)
EGFR (African American): >60
Osmolality: 278 (ref 275–301)
Potassium: 3.9 mmol/L (ref 3.5–5.1)

## 2013-05-23 LAB — CK TOTAL AND CKMB (NOT AT ARMC): CK, Total: 103 U/L (ref 21–215)

## 2013-05-23 LAB — CBC
HCT: 44.1 % (ref 35.0–47.0)
MCH: 30.6 pg (ref 26.0–34.0)
MCV: 87 fL (ref 80–100)
Platelet: 190 10*3/uL (ref 150–440)
RBC: 5.05 10*6/uL (ref 3.80–5.20)
RDW: 13.1 % (ref 11.5–14.5)
WBC: 8.1 10*3/uL (ref 3.6–11.0)

## 2013-05-23 LAB — TROPONIN I: Troponin-I: <0.02 ng/mL

## 2013-07-22 ENCOUNTER — Encounter: Payer: Self-pay | Admitting: *Deleted

## 2013-07-23 ENCOUNTER — Ambulatory Visit: Payer: PRIVATE HEALTH INSURANCE | Admitting: Internal Medicine

## 2014-05-16 ENCOUNTER — Other Ambulatory Visit: Payer: Self-pay | Admitting: Ophthalmology

## 2014-05-16 LAB — CBC WITH DIFFERENTIAL/PLATELET
Basophil #: 0.1 10*3/uL (ref 0.0–0.1)
Basophil %: 0.8 %
EOS PCT: 1.3 %
Eosinophil #: 0.1 10*3/uL (ref 0.0–0.7)
HCT: 44.9 % (ref 35.0–47.0)
HGB: 15.2 g/dL (ref 12.0–16.0)
Lymphocyte #: 2.3 10*3/uL (ref 1.0–3.6)
Lymphocyte %: 34.6 %
MCH: 30.4 pg (ref 26.0–34.0)
MCHC: 34 g/dL (ref 32.0–36.0)
MCV: 89 fL (ref 80–100)
MONO ABS: 0.4 x10 3/mm (ref 0.2–0.9)
MONOS PCT: 6.7 %
Neutrophil #: 3.7 10*3/uL (ref 1.4–6.5)
Neutrophil %: 56.6 %
Platelet: 189 10*3/uL (ref 150–440)
RBC: 5.02 10*6/uL (ref 3.80–5.20)
RDW: 13.3 % (ref 11.5–14.5)
WBC: 6.6 10*3/uL (ref 3.6–11.0)

## 2014-05-16 LAB — GLUCOSE, RANDOM: Glucose: 97 mg/dL (ref 65–99)

## 2014-09-01 ENCOUNTER — Emergency Department: Payer: Self-pay | Admitting: Emergency Medicine

## 2014-09-01 ENCOUNTER — Telehealth: Payer: Self-pay | Admitting: Internal Medicine

## 2014-09-01 LAB — BASIC METABOLIC PANEL
Anion Gap: 7 (ref 7–16)
BUN: 18 mg/dL (ref 7–18)
CHLORIDE: 106 mmol/L (ref 98–107)
Calcium, Total: 9.1 mg/dL (ref 8.5–10.1)
Co2: 26 mmol/L (ref 21–32)
Creatinine: 0.93 mg/dL (ref 0.60–1.30)
EGFR (Non-African Amer.): >60
Glucose: 89 mg/dL (ref 65–99)
Osmolality: 279 (ref 275–301)
Potassium: 3.8 mmol/L (ref 3.5–5.1)
Sodium: 139 mmol/L (ref 136–145)

## 2014-09-01 LAB — CBC
HCT: 43.9 % (ref 35.0–47.0)
HGB: 14.4 g/dL (ref 12.0–16.0)
MCH: 29.8 pg (ref 26.0–34.0)
MCHC: 32.8 g/dL (ref 32.0–36.0)
MCV: 91 fL (ref 80–100)
PLATELETS: 176 10*3/uL (ref 150–440)
RBC: 4.83 10*6/uL (ref 3.80–5.20)
RDW: 12.8 % (ref 11.5–14.5)
WBC: 8.7 10*3/uL (ref 3.6–11.0)

## 2014-09-01 LAB — PRO B NATRIURETIC PEPTIDE: B-Type Natriuretic Peptide: 68 pg/mL (ref 0–125)

## 2014-09-01 LAB — TROPONIN I

## 2014-09-01 LAB — PROTIME-INR
INR: 0.9
Prothrombin Time: 12.2 secs (ref 11.5–14.7)

## 2014-09-01 NOTE — Telephone Encounter (Signed)
Pt called and left a msg that she is having a headaches, chest tightness and elevated BP. Called patient back and advise that she speak with the triage nurse. Pt connected with the triage nurse.msn

## 2014-09-01 NOTE — Telephone Encounter (Signed)
Called patient and was advised by patient that she was going to ED as soon as she caught up at work.

## 2014-09-01 NOTE — Telephone Encounter (Signed)
Patient Information:  Caller Name: Burna MortimerWanda  Phone: [phone]  Patient: Tonya Hopkins, Tonya Hopkins  Gender: Female  DOB: 12-01-1955  Age: [age] Years  PCP: Duncan Dullullo, Teresa (Adults only)  Office Follow Up:  Does the office need to follow up with this patient?: Yes  Instructions For The Office: Please advise if you can offer work-in appt. for Chest tightness and Dizziness or if you would prefer the pt. go to the ED at [facility].  RN Note:  Will send Urgent note to the office to see if they can work her in or if she should go to [facility].  Symptoms  Reason For Call & Symptoms: Onset with dizziness on 08/31/14 last night. Chest tightness.Pain does not radiate. No sweats. Has had this before, went to ED and they determined she was okay. Last night BP was 167/99. P-70. Pt. took Baby ASA and NTG. Had tightness in chest and it went away. Also has been having some shakey episodes like her Blood sugar is low. Pt. states not taking any medication for BP but has been normal up until now.  Reviewed Health History In EMR: Yes  Reviewed Medications In EMR: Yes  Reviewed Allergies In EMR: Yes  Reviewed Surgeries / Procedures: Yes  Date of Onset of Symptoms: 08/31/2014  Guideline(s) Used:  Chest Pain  Disposition Per Guideline:   Go to ED Now (or to Office with PCP Approval)  Reason For Disposition Reached:   Chest pain lasting longer than 5 minutes  Advice Given:  Call Back If:  Severe chest pain  Constant chest pain lasting longer than 5 minutes  Difficulty breathing  Fever  You become worse.  Patient Will Follow Care Advice:  YES

## 2014-09-01 NOTE — Telephone Encounter (Signed)
Spoke with patient & advised that she go to the ER based on her symptoms (pt was still at work).

## 2015-11-29 DIAGNOSIS — G459 Transient cerebral ischemic attack, unspecified: Secondary | ICD-10-CM

## 2015-11-29 HISTORY — DX: Transient cerebral ischemic attack, unspecified: G45.9

## 2016-08-24 ENCOUNTER — Telehealth: Payer: Self-pay | Admitting: *Deleted

## 2016-08-24 NOTE — Telephone Encounter (Signed)
Pt would like to re-established care with Dr. Darrick Huntsmanullo. Please advise if this will be ok. Pt contact [phone]  Work [phone]

## 2016-08-24 NOTE — Telephone Encounter (Signed)
Dr. Darrick Huntsmanullo has stated she is not taking any new patient and has turned down patient his week. Dr. Darrick Huntsmanullo is out of the office til next week.

## 2016-08-25 NOTE — Telephone Encounter (Signed)
[facility]

## 2016-11-09 ENCOUNTER — Other Ambulatory Visit: Payer: Self-pay | Admitting: Pediatrics

## 2016-11-09 DIAGNOSIS — Z1231 Encounter for screening mammogram for malignant neoplasm of breast: Secondary | ICD-10-CM

## 2016-12-20 ENCOUNTER — Ambulatory Visit
Admission: RE | Admit: 2016-12-20 | Discharge: 2016-12-20 | Disposition: A | Discharge status: Home / Self Care | Payer: PRIVATE HEALTH INSURANCE | Source: Ambulatory Visit | Attending: Pediatrics | Admitting: Pediatrics

## 2016-12-20 DIAGNOSIS — Z1231 Encounter for screening mammogram for malignant neoplasm of breast: Secondary | ICD-10-CM | POA: Diagnosis not present

## 2018-01-26 ENCOUNTER — Emergency Department: Payer: PRIVATE HEALTH INSURANCE

## 2018-01-26 ENCOUNTER — Observation Stay
Admission: EM | Admit: 2018-01-26 | Discharge: 2018-01-27 | Disposition: A | Discharge status: Home / Self Care | Payer: PRIVATE HEALTH INSURANCE | Attending: Internal Medicine | Admitting: Internal Medicine

## 2018-01-26 ENCOUNTER — Other Ambulatory Visit: Payer: Self-pay

## 2018-01-26 ENCOUNTER — Observation Stay: Payer: PRIVATE HEALTH INSURANCE

## 2018-01-26 ENCOUNTER — Observation Stay (HOSPITAL_BASED_OUTPATIENT_CLINIC_OR_DEPARTMENT_OTHER)
Admit: 2018-01-26 | Discharge: 2018-01-26 | Disposition: A | Discharge status: Home / Self Care | Payer: PRIVATE HEALTH INSURANCE | Attending: Internal Medicine | Admitting: Internal Medicine

## 2018-01-26 ENCOUNTER — Encounter: Payer: Self-pay | Admitting: Emergency Medicine

## 2018-01-26 DIAGNOSIS — Z6833 Body mass index (BMI) 33.0-33.9, adult: Secondary | ICD-10-CM | POA: Diagnosis not present

## 2018-01-26 DIAGNOSIS — E669 Obesity, unspecified: Secondary | ICD-10-CM | POA: Diagnosis not present

## 2018-01-26 DIAGNOSIS — R079 Chest pain, unspecified: Secondary | ICD-10-CM | POA: Insufficient documentation

## 2018-01-26 DIAGNOSIS — Z87440 Personal history of urinary (tract) infections: Secondary | ICD-10-CM | POA: Diagnosis not present

## 2018-01-26 DIAGNOSIS — Z79899 Other long term (current) drug therapy: Secondary | ICD-10-CM | POA: Insufficient documentation

## 2018-01-26 DIAGNOSIS — R05 Cough: Secondary | ICD-10-CM | POA: Insufficient documentation

## 2018-01-26 DIAGNOSIS — R0989 Other specified symptoms and signs involving the circulatory and respiratory systems: Secondary | ICD-10-CM | POA: Diagnosis not present

## 2018-01-26 DIAGNOSIS — Z823 Family history of stroke: Secondary | ICD-10-CM | POA: Diagnosis not present

## 2018-01-26 DIAGNOSIS — J320 Chronic maxillary sinusitis: Secondary | ICD-10-CM | POA: Insufficient documentation

## 2018-01-26 DIAGNOSIS — Z7982 Long term (current) use of aspirin: Secondary | ICD-10-CM | POA: Insufficient documentation

## 2018-01-26 DIAGNOSIS — I5030 Unspecified diastolic (congestive) heart failure: Secondary | ICD-10-CM

## 2018-01-26 DIAGNOSIS — F419 Anxiety disorder, unspecified: Secondary | ICD-10-CM | POA: Diagnosis not present

## 2018-01-26 DIAGNOSIS — Z8249 Family history of ischemic heart disease and other diseases of the circulatory system: Secondary | ICD-10-CM | POA: Diagnosis not present

## 2018-01-26 DIAGNOSIS — I10 Essential (primary) hypertension: Secondary | ICD-10-CM | POA: Insufficient documentation

## 2018-01-26 DIAGNOSIS — G459 Transient cerebral ischemic attack, unspecified: Principal | ICD-10-CM | POA: Diagnosis present

## 2018-01-26 DIAGNOSIS — M1610 Unilateral primary osteoarthritis, unspecified hip: Secondary | ICD-10-CM | POA: Insufficient documentation

## 2018-01-26 LAB — CBC
HEMATOCRIT: 45.8 % (ref 35.0–47.0)
HEMOGLOBIN: 15.7 g/dL (ref 12.0–16.0)
MCH: 30.3 pg (ref 26.0–34.0)
MCHC: 34.2 g/dL (ref 32.0–36.0)
MCV: 88.6 fL (ref 80.0–100.0)
Platelets: 207 10*3/uL (ref 150–440)
RBC: 5.17 MIL/uL (ref 3.80–5.20)
RDW: 13.4 % (ref 11.5–14.5)
WBC: 7.5 10*3/uL (ref 3.6–11.0)

## 2018-01-26 LAB — URINALYSIS, ROUTINE W REFLEX MICROSCOPIC
Bilirubin Urine: NEGATIVE
Glucose, UA: NEGATIVE mg/dL
HGB URINE DIPSTICK: NEGATIVE
Ketones, ur: NEGATIVE mg/dL
Leukocytes, UA: NEGATIVE
Nitrite: NEGATIVE
Protein, ur: NEGATIVE mg/dL
SPECIFIC GRAVITY, URINE: 1.04 — AB (ref 1.005–1.030)
pH: 7 (ref 5.0–8.0)

## 2018-01-26 LAB — ECHOCARDIOGRAM COMPLETE
HEIGHTINCHES: 63 in
WEIGHTICAEL: 3040 [oz_av]

## 2018-01-26 LAB — COMPREHENSIVE METABOLIC PANEL
ALBUMIN: 4.4 g/dL (ref 3.5–5.0)
ALK PHOS: 55 U/L (ref 38–126)
ALT: 40 U/L (ref 14–54)
AST: 34 U/L (ref 15–41)
Anion gap: 8 (ref 5–15)
BILIRUBIN TOTAL: 0.7 mg/dL (ref 0.3–1.2)
BUN: 13 mg/dL (ref 6–20)
CALCIUM: 9.8 mg/dL (ref 8.9–10.3)
CO2: 25 mmol/L (ref 22–32)
CREATININE: 0.83 mg/dL (ref 0.44–1.00)
Chloride: 103 mmol/L (ref 101–111)
GFR calc Af Amer: >60 mL/min (ref >60)
GFR calc non Af Amer: >60 mL/min (ref >60)
GLUCOSE: 93 mg/dL (ref 65–99)
Potassium: 3.9 mmol/L (ref 3.5–5.1)
Sodium: 136 mmol/L (ref 135–145)
TOTAL PROTEIN: 8 g/dL (ref 6.5–8.1)

## 2018-01-26 LAB — URINE DRUG SCREEN, QUALITATIVE (ARMC ONLY)
AMPHETAMINES, UR SCREEN: NOT DETECTED
Barbiturates, Ur Screen: NOT DETECTED
Benzodiazepine, Ur Scrn: NOT DETECTED
Cannabinoid 50 Ng, Ur ~~LOC~~: NOT DETECTED
Cocaine Metabolite,Ur ~~LOC~~: NOT DETECTED
MDMA (ECSTASY) UR SCREEN: NOT DETECTED
Methadone Scn, Ur: NOT DETECTED
OPIATE, UR SCREEN: NOT DETECTED
PHENCYCLIDINE (PCP) UR S: NOT DETECTED
Tricyclic, Ur Screen: NOT DETECTED

## 2018-01-26 LAB — PROTIME-INR
INR: 0.91
Prothrombin Time: 12.2 seconds (ref 11.4–15.2)

## 2018-01-26 LAB — TROPONIN I
Troponin I: <0.03 ng/mL (ref <0.03)
Troponin I: <0.03 ng/mL (ref <0.03)
Troponin I: <0.03 ng/mL (ref <0.03)

## 2018-01-26 LAB — APTT: aPTT: 31 seconds (ref 24–36)

## 2018-01-26 MED ORDER — HYDROCHLOROTHIAZIDE 25 MG PO TABS
25.0000 mg | ORAL_TABLET | Freq: Every day | ORAL | Status: DC
  Administered 2018-01-26 – 2018-01-27 (×2): 25 mg via ORAL
  Filled 2018-01-26 (×2): qty 1

## 2018-01-26 MED ORDER — ENOXAPARIN SODIUM 40 MG/0.4ML ~~LOC~~ SOLN
40.0000 mg | SUBCUTANEOUS | Status: DC
  Administered 2018-01-26: 40 mg via SUBCUTANEOUS
  Filled 2018-01-26: qty 0.4

## 2018-01-26 MED ORDER — ACETAMINOPHEN 160 MG/5ML PO SOLN
650.0000 mg | ORAL | Status: DC | PRN
  Filled 2018-01-26: qty 20.3

## 2018-01-26 MED ORDER — ACETAMINOPHEN 500 MG PO TABS
1000.0000 mg | ORAL_TABLET | Freq: Once | ORAL | Status: AC
  Administered 2018-01-26: 1000 mg via ORAL
  Filled 2018-01-26: qty 2

## 2018-01-26 MED ORDER — IOPAMIDOL (ISOVUE-370) INJECTION 76%
75.0000 mL | Freq: Once | INTRAVENOUS | Status: AC | PRN
  Administered 2018-01-26: 75 mL via INTRAVENOUS

## 2018-01-26 MED ORDER — ACETAMINOPHEN 650 MG RE SUPP: 650.0000 mg | RECTAL | Status: DC | PRN

## 2018-01-26 MED ORDER — PREMIER PROTEIN SHAKE
11.0000 [oz_av] | ORAL | Status: DC
  Administered 2018-01-26: 11 [oz_av] via ORAL

## 2018-01-26 MED ORDER — MONTELUKAST SODIUM 10 MG PO TABS
10.0000 mg | ORAL_TABLET | Freq: Every day | ORAL | Status: DC
  Administered 2018-01-26: 10 mg via ORAL
  Filled 2018-01-26: qty 1

## 2018-01-26 MED ORDER — ASPIRIN 81 MG PO CHEW
324.0000 mg | CHEWABLE_TABLET | Freq: Once | ORAL | Status: AC
  Administered 2018-01-26: 324 mg via ORAL
  Filled 2018-01-26: qty 4

## 2018-01-26 MED ORDER — ACETAMINOPHEN 325 MG PO TABS
650.0000 mg | ORAL_TABLET | ORAL | Status: DC | PRN
  Administered 2018-01-26 – 2018-01-27 (×2): 650 mg via ORAL
  Filled 2018-01-26 (×2): qty 2

## 2018-01-26 MED ORDER — STROKE: EARLY STAGES OF RECOVERY BOOK
Freq: Once | Status: AC
  Administered 2018-01-26: 14:00:00

## 2018-01-26 MED ORDER — GUAIFENESIN ER 600 MG PO TB12
600.0000 mg | ORAL_TABLET | Freq: Two times a day (BID) | ORAL | Status: DC
  Administered 2018-01-26 – 2018-01-27 (×2): 600 mg via ORAL
  Filled 2018-01-26 (×2): qty 1

## 2018-01-26 MED ORDER — FLUTICASONE PROPIONATE 50 MCG/ACT NA SUSP
2.0000 | Freq: Every day | NASAL | Status: DC
  Administered 2018-01-27: 12:00:00 2 via NASAL
  Filled 2018-01-26: qty 16

## 2018-01-26 MED ORDER — GUAIFENESIN-DM 100-10 MG/5ML PO SYRP
5.0000 mL | ORAL_SOLUTION | ORAL | Status: DC | PRN
  Administered 2018-01-26 – 2018-01-27 (×2): 5 mL via ORAL
  Filled 2018-01-26 (×3): qty 5

## 2018-01-26 NOTE — Evaluation (Signed)
Physical Therapy Evaluation Patient Details Name: Primitivo GauzeWanda H Sena MRN: [id number] DOB: [birth year] Today's Date: 01/26/2018   History of Present Illness  Pt admitted for possible TIA. Complaints of chest pain and vision deficits while driving to work. History includes anxiety and HTN. Recent flu positive last month.  Clinical Impression  Pt is a pleasant 62 year old female who was admitted for possible TIA. Pt demonstrates all bed mobility/transfers/ambulation at baseline level. Sensation/coordination intact. No strength deficits noted and pt reports vision back to baseline. Pt does not require any further PT needs at this time. Pt will be dc in house and does not require follow up. RN aware. Will dc current orders.      Follow Up Recommendations No PT follow up    Equipment Recommendations  None recommended by PT    Recommendations for Other Services       Precautions / Restrictions Precautions Precautions: Fall Restrictions Weight Bearing Restrictions: No      Mobility  Bed Mobility Overal bed mobility: Independent             General bed mobility comments: safe technique performed  Transfers Overall transfer level: Independent Equipment used: None             General transfer comment: safe technique  Ambulation/Gait Ambulation/Gait assistance: Independent Ambulation Distance (Feet): 40 Feet Assistive device: None Gait Pattern/deviations: WFL(Within Functional Limits)     General Gait Details: ambulated in room, safely. good speed and no LOB noted.   Stairs            Wheelchair Mobility    Modified Rankin (Stroke Patients Only)       Balance Overall balance assessment: Independent                                           Pertinent Vitals/Pain Pain Assessment: No/denies pain    Home Living Family/patient expects to be discharged to:: Private residence Living Arrangements: Children Available Help at Discharge:  Family Type of Home: House Home Access: Stairs to enter   Secretary/administratorntrance Stairs-Number of Steps: 2 Home Layout: One level Home Equipment: None      Prior Function Level of Independence: Independent         Comments: working     Higher education careers adviserHand Dominance        Extremity/Trunk Assessment   Upper Extremity Assessment Upper Extremity Assessment: Overall WFL for tasks assessed    Lower Extremity Assessment Lower Extremity Assessment: Overall WFL for tasks assessed       Communication   Communication: No difficulties  Cognition Arousal/Alertness: Awake/alert Behavior During Therapy: WFL for tasks assessed/performed Overall Cognitive Status: Within Functional Limits for tasks assessed                                        General Comments      Exercises     Assessment/Plan    PT Assessment Patent does not need any further PT services  PT Problem List         PT Treatment Interventions      PT Goals (Current goals can be found in the Care Plan section)  Acute Rehab PT Goals Patient Stated Goal: to go home PT Goal Formulation: All assessment and education complete, DC therapy Time For Goal  Achievement: 01/26/18 Potential to Achieve Goals: Good    Frequency     Barriers to discharge        Co-evaluation               AM-PAC PT "6 Clicks" Daily Activity  Outcome Measure Difficulty turning over in bed (including adjusting bedclothes, sheets and blankets)?: None Difficulty moving from lying on back to sitting on the side of the bed? : None Difficulty sitting down on and standing up from a chair with arms (e.g., wheelchair, bedside commode, etc,.)?: None Help needed moving to and from a bed to chair (including a wheelchair)?: None Help needed walking in hospital room?: None Help needed climbing 3-5 steps with a railing? : None 6 Click Score: 24    End of Session   Activity Tolerance: Patient tolerated treatment well Patient left: in  bed Nurse Communication: Mobility status PT Visit Diagnosis: Muscle weakness (generalized) (M62.81)    Time: 9604-5409 PT Time Calculation (min) (ACUTE ONLY): 9 min   Charges:   PT Evaluation $PT Eval Low Complexity: 1 Low     PT G CodesElizabeth Palau, PT, DPT [phone]   Jonisha Kindig 01/26/2018, 3:53 PM

## 2018-01-26 NOTE — ED Provider Notes (Signed)
[facility] Emergency Department Provider Note  Time seen: 9:49 AM  I have reviewed the triage vital signs and the nursing notes.   HISTORY  Chief Complaint Chest Pain    HPI Tonya Hopkins is a 62 y.o. female with a past medical history of anxiety, hypertension, presents to the emergency department after a visual deficit.  According to the patient last night she was experiencing some mild chest tightness, noticed mild chest tightness today as well.  Patient states she was not overly concerned over the chest tightness, was on her way to work when around 810 this morning she had a visual deficit.  States the left side of her vision in both eyes went gray and she could not see.  States this lasted approximately 20-30 minutes and then slowly resolved on its own.  Patient does state moderate headache this morning.  Denies any weakness, numbness, confusion, slurred speech.  No history of CVA personally but strong family history of CVA per patient with brother having multiple strokes.  Patient takes no anticoagulation, no aspirin.  Patient states mild congestion otherwise largely negative review of systems.   Past Medical History:  Diagnosis Date  . Anxiety   . Headache, frequent episodic tension-type   . Hypertension    without treatment  . OA (osteoarthritis) of hip   . Recurrent urinary tract infection     Patient Active Problem List   Diagnosis Date Noted  . Rectal bleeding 01/20/2013  . Obesity, unspecified 01/20/2013  . Anxiety   . OA (osteoarthritis) of hip   . Screening for cervical cancer 07/05/2012  . Routine general medical examination at a health care facility 07/05/2012  . Hypertension   . Headache, frequent episodic tension-type     Past Surgical History:  Procedure Laterality Date  . APPENDECTOMY    . BREAST CYST ASPIRATION Right   . TONSILLECTOMY AND ADENOIDECTOMY  1971    Prior to Admission medications   Medication Sig Start Date End  Date Taking? Authorizing Provider  fexofenadine-pseudoephedrine (ALLEGRA-D 24) 180-240 MG 24 hr tablet Take 1 tablet by mouth daily.   Yes [provider]  hydrochlorothiazide (HYDRODIURIL) 25 MG tablet Take 1 tablet (25 mg total) by mouth daily. 03/13/13  Yes Sherlene Shams, MD  promethazine-dextromethorphan (PROMETHAZINE-DM) 6.25-15 MG/5ML syrup Take 5 mLs by mouth every 6 (six) hours as needed. 01/13/18  Yes [provider]  escitalopram (LEXAPRO) 5 MG tablet Take 1 tablet (5 mg total) by mouth daily. Patient not taking: Reported on 01/26/2018 01/17/13   Sherlene Shams, MD  omeprazole (PRILOSEC) 20 MG capsule Take 1 capsule (20 mg total) by mouth daily. Patient not taking: Reported on 01/26/2018 01/02/13   Sherlene Shams, MD  traMADol Janean Sark) 50 MG tablet 1 or 2 tablets every 8 hours as needed for pain Patient not taking: Reported on 01/26/2018 01/03/13   Sherlene Shams, MD  zolpidem (AMBIEN) 5 MG tablet Take 1 tablet (5 mg total) by mouth at bedtime as needed for sleep. Patient not taking: Reported on 01/26/2018 01/02/13   Sherlene Shams, MD    No Known Allergies  Family History  Problem Relation Age of Onset  . Osteoporosis Mother   . Heart disease Father   . Hypertension Father   . Osteoporosis Sister   . Hypertension Brother   . Diabetes Brother   . Stroke Brother 23  . Breast cancer Neg Hx     Social History Social History  Tobacco Use  . Smoking status: Never Smoker  . Smokeless tobacco: Never Used  Substance Use Topics  . Alcohol use: No  . Drug use: No    Review of Systems Constitutional: Negative for fever. Eyes: Negative for visual complaints ENT: Mild congestion Cardiovascular: Chest tightness this morning, largely resolved. Respiratory: Negative for shortness of breath.  Mild cough. Gastrointestinal: Negative for abdominal pain, vomiting and diarrhea. Genitourinary: Negative for urinary compaints Musculoskeletal: Negative for musculoskeletal  complaints Skin: Negative for skin complaints  Neurological: Moderate headache, left visual deficit lasting 20-30 minutes, now resolved.  No weakness or numbness of any extremity. All other ROS negative  ____________________________________________   PHYSICAL EXAM:  VITAL SIGNS: ED Triage Vitals  Enc Vitals Group     BP 01/26/18 0905 (!) 152/92     Pulse Rate 01/26/18 0905 93     Resp 01/26/18 0905 18     Temp 01/26/18 0905 98.1 F (36.7 C)     Temp Source 01/26/18 0905 Oral     SpO2 01/26/18 0905 96 %     Weight 01/26/18 0906 190 lb (86.2 kg)     Height 01/26/18 0906 5\' 3"  (1.6 m)     Head Circumference --      Peak Flow --      Pain Score 01/26/18 0912 5     Pain Loc --      Pain Edu? --      Excl. in GC? --     Constitutional: Alert and oriented. Well appearing and in no distress. Eyes: Normal exam ENT   Head: Normocephalic and atraumatic.   Nose: No congestion/rhinnorhea.   Mouth/Throat: Mucous membranes are moist. Cardiovascular: Normal rate, regular rhythm. No murmurs, rubs, or gallops. Respiratory: Normal respiratory effort without tachypnea nor retractions. Breath sounds are clear and equal bilaterally. No wheezes/rales/rhonchi. Gastrointestinal: Soft and nontender. No distention.  Musculoskeletal: Nontender with normal range of motion in all extremities. Neurologic:  Normal speech and language. No gross focal neurologic deficit.  Equal grip strength bilaterally.  No pronator drift.  No lower extremity drift.  Cranial nerves intact.  EOMI, Perl.  Visual fields intact.   Skin:  Skin is warm, dry and intact.  Psychiatric: Mood and affect are normal.  ____________________________________________    EKG  EKG reviewed and interpreted by myself shows normal sinus rhythm at 94 bpm with a narrow QRS, normal axis, normal intervals, no concerning ST changes.  ____________________________________________    RADIOLOGY  CT  negative  ____________________________________________   INITIAL IMPRESSION / ASSESSMENT AND PLAN / ED COURSE  Pertinent labs & imaging results that were available during my care of the patient were reviewed by me and considered in my medical decision making (see chart for details).  She presents emergency department with a headache, left visual deficit around 810 this morning.  Patient symptoms have resolved, continues to have mild chest tightness.  Differential would include CVA, TIA, anxiety, ophthalmic abnormality.  We will check labs, CT head, chest x-ray and continue to closely monitor.  I discussed the patient with neurology they recommend initial ER workup followed by TIA admission.   NIH Stroke Scale   Interval: Baseline Time: 9:54 AM Person Administering Scale: Minna Antis  Administer stroke scale items in the order listed. Record performance in each category after each subscale exam. Do not go back and change scores. Follow directions provided for each exam technique. Scores should reflect what the patient does, not what the clinician thinks the patient can do.  The clinician should record answers while administering the exam and work quickly. Except where indicated, the patient should not be coached (i.e., repeated requests to patient to make a special effort).   1a  Level of consciousness: 0=alert; keenly responsive  1b. LOC questions:  0=Performs both tasks correctly  1c. LOC commands: 0=Performs both tasks correctly  2.  Best Gaze: 0=normal  3.  Visual: 0=No visual loss  4. Facial Palsy: 0=Normal symmetric movement  5a.  Motor left arm: 0=No drift, limb holds 90 (or 45) degrees for full 10 seconds  5b.  Motor right arm: 0=No drift, limb holds 90 (or 45) degrees for full 10 seconds  6a. motor left leg: 0=No drift, limb holds 90 (or 45) degrees for full 10 seconds  6b  Motor right leg:  0=No drift, limb holds 90 (or 45) degrees for full 10 seconds  7. Limb Ataxia:  0=Absent  8.  Sensory: 0=Normal; no sensory loss  9. Best Language:  0=No aphasia, normal  10. Dysarthria: 0=Normal  11. Extinction and Inattention: 0=No abnormality  12. Distal motor function: 0=Normal   Total:   0   ____________________________________________   FINAL CLINICAL IMPRESSION(S) / ED DIAGNOSES  Transient ischemic attack    Minna AntisPaduchowski, Shavawn Stobaugh, MD 01/26/18 1123

## 2018-01-26 NOTE — ED Triage Notes (Signed)
Has some mild chest pain last night that is described as constant.  Went to bed at 11 pm. Woke up in middle of night from headache.  This morning on way to work started with vision changes and nausea.  Could not see right side of words. This lasted 30 minutes and resolved.  Still has headache. Last normal was 11 pm.

## 2018-01-26 NOTE — H&P (Signed)
[facility] - [place] [facility]   PATIENT NAME: Tonya Hopkins    MR#:  [id number]  DATE OF BIRTH:  12-13-1955  DATE OF ADMISSION:  01/26/2018  PRIMARY CARE PHYSICIAN: [facility]   REQUESTING/REFERRING PHYSICIAN: Minna Antis, MD  CHIEF COMPLAINT:   Chief Complaint  Patient presents with  . Chest Pain    HISTORY OF PRESENT ILLNESS: Tonya Hopkins  is a 62 y.o. female with a known history of diet, essential hypertension, osteoarthritis who is presenting to the ED with complaint of chest pressure as well as brief visual loss.  Patient states that around 810 this morning she had visual deficit everything went gray for about 20-30 minutes.  Now her vision is return back to normal.  CT scan of the head is negative.  Patient has never had these symptoms before.  She otherwise denies any weakness or difficulty swallowing.  Patient also reports that recently she was diagnosed with the flu last month and was treated for Tamiflu.  She continues to have some cough and congestion.  She states that she started having chest pressure since last night like someone was sitting on her chest.  She has not experienced this type of symptoms before.  It is unrelated to activity. PAST MEDICAL HISTORY:   Past Medical History:  Diagnosis Date  . Anxiety   . Headache, frequent episodic tension-type   . Hypertension    without treatment  . OA (osteoarthritis) of hip   . Recurrent urinary tract infection     PAST SURGICAL HISTORY:  Past Surgical History:  Procedure Laterality Date  . APPENDECTOMY    . BREAST CYST ASPIRATION Right   . TONSILLECTOMY AND ADENOIDECTOMY  1971    SOCIAL HISTORY:  Social History   Tobacco Use  . Smoking status: Never Smoker  . Smokeless tobacco: Never Used  Substance Use Topics  . Alcohol use: No    FAMILY HISTORY:  Family History  Problem Relation Age of Onset  . Osteoporosis Mother   . Heart disease Father   . Hypertension Father    . Osteoporosis Sister   . Hypertension Brother   . Diabetes Brother   . Stroke Brother 50  . Breast cancer Neg Hx     DRUG ALLERGIES: No Known Allergies  REVIEW OF SYSTEMS:   CONSTITUTIONAL: No fever, fatigue or weakness.  EYES: No blurred or double vision.  EARS, NOSE, AND THROAT: No tinnitus or ear pain.  RESPIRATORY: Dry cough, shortness of breath, wheezing or hemoptysis.  CARDIOVASCULAR: No chest pain, orthopnea, edema.  GASTROINTESTINAL: No nausea, vomiting, diarrhea or abdominal pain.  GENITOURINARY: No dysuria, hematuria.  ENDOCRINE: No polyuria, nocturia,  HEMATOLOGY: No anemia, easy bruising or bleeding SKIN: No rash or lesion. MUSCULOSKELETAL: No joint pain or arthritis.   NEUROLOGIC: No tingling, numbness, weakness.  Left-sided visual loss that resolved PSYCHIATRY: No anxiety or depression.   MEDICATIONS AT HOME:  Prior to Admission medications   Medication Sig Start Date End Date Taking? Authorizing Provider  fexofenadine-pseudoephedrine (ALLEGRA-D 24) 180-240 MG 24 hr tablet Take 1 tablet by mouth daily.   Yes [provider]  hydrochlorothiazide (HYDRODIURIL) 25 MG tablet Take 1 tablet (25 mg total) by mouth daily. 03/13/13  Yes Sherlene Shams, MD  promethazine-dextromethorphan (PROMETHAZINE-DM) 6.25-15 MG/5ML syrup Take 5 mLs by mouth every 6 (six) hours as needed. 01/13/18  Yes [provider]  escitalopram (LEXAPRO) 5 MG tablet Take 1 tablet (5 mg total) by mouth daily. Patient not  taking: Reported on 01/26/2018 01/17/13   Sherlene Shams, MD  omeprazole (PRILOSEC) 20 MG capsule Take 1 capsule (20 mg total) by mouth daily. Patient not taking: Reported on 01/26/2018 01/02/13   Sherlene Shams, MD  traMADol Janean Sark) 50 MG tablet 1 or 2 tablets every 8 hours as needed for pain Patient not taking: Reported on 01/26/2018 01/03/13   Sherlene Shams, MD  zolpidem (AMBIEN) 5 MG tablet Take 1 tablet (5 mg total) by mouth at bedtime as needed for sleep. Patient  not taking: Reported on 01/26/2018 01/02/13   Sherlene Shams, MD      PHYSICAL EXAMINATION:   VITAL SIGNS: Blood pressure (!) 152/92, pulse 93, temperature 98.1 F (36.7 C), temperature source Oral, resp. rate 18, height 5\' 3"  (1.6 m), weight 190 lb (86.2 kg), SpO2 96 %.  GENERAL:  62 y.o.-year-old patient lying in the bed with no acute distress.  EYES: Pupils equal, round, reactive to light and accommodation. No scleral icterus. Extraocular muscles intact.  HEENT: Head atraumatic, normocephalic. Oropharynx and nasopharynx clear.  NECK:  Supple, no jugular venous distention. No thyroid enlargement, no tenderness.  LUNGS: Normal breath sounds bilaterally, no wheezing, rales,rhonchi or crepitation. No use of accessory muscles of respiration.  CARDIOVASCULAR: S1, S2 normal. No murmurs, rubs, or gallops.  ABDOMEN: Soft, nontender, nondistended. Bowel sounds present. No organomegaly or mass.  EXTREMITIES: No pedal edema, cyanosis, or clubbing.  NEUROLOGIC: Cranial nerves II through XII are intact. Muscle strength 5/5 in all extremities. Sensation intact. Gait not checked.  PSYCHIATRIC: The patient is alert and oriented x 3.  SKIN: No obvious rash, lesion, or ulcer.   LABORATORY PANEL:   CBC Recent Labs  Lab 01/26/18 0937  WBC 7.5  HGB 15.7  HCT 45.8  PLT 207  MCV 88.6  MCH 30.3  MCHC 34.2  RDW 13.4   ------------------------------------------------------------------------------------------------------------------  Chemistries  Recent Labs  Lab 01/26/18 0937  NA 136  K 3.9  CL 103  CO2 25  GLUCOSE 93  BUN 13  CREATININE 0.83  CALCIUM 9.8  AST 34  ALT 40  ALKPHOS 55  BILITOT 0.7   ------------------------------------------------------------------------------------------------------------------ estimated creatinine clearance is 74 mL/min (by C-G formula based on SCr of 0.83  mg/dL). ------------------------------------------------------------------------------------------------------------------ No results for input(s): TSH, T4TOTAL, T3FREE, THYROIDAB in the last 72 hours.  Invalid input(s): FREET3   Coagulation profile Recent Labs  Lab 01/26/18 0937  INR 0.91   ------------------------------------------------------------------------------------------------------------------- No results for input(s): DDIMER in the last 72 hours. -------------------------------------------------------------------------------------------------------------------  Cardiac Enzymes Recent Labs  Lab 01/26/18 0937  TROPONINI <0.03   ------------------------------------------------------------------------------------------------------------------ Invalid input(s): POCBNP  ---------------------------------------------------------------------------------------------------------------  Urinalysis No results found for: COLORURINE, APPEARANCEUR, LABSPEC, PHURINE, GLUCOSEU, HGBUR, BILIRUBINUR, KETONESUR, PROTEINUR, UROBILINOGEN, NITRITE, LEUKOCYTESUR   RADIOLOGY: Dg Chest 2 View  Result Date: 01/26/2018 CLINICAL DATA:  Patient reports blurred vision, central CP, SOB and headache onset today.Denies fever or cough. Hx HTN. Non-smoker. EXAM: CHEST - 2 VIEW COMPARISON:  09/01/2014 FINDINGS: Lungs are clear. Heart size and mediastinal contours are within normal limits. No effusion.  No pneumothorax. Visualized bones unremarkable. IMPRESSION: No acute cardiopulmonary disease. Electronically Signed   By: Corlis Leak M.D.   On: 01/26/2018 10:30   Ct Head Wo Contrast  Result Date: 01/26/2018 CLINICAL DATA:  Visual loss. Patient woke up with headache and nausea. EXAM: CT HEAD WITHOUT CONTRAST TECHNIQUE: Contiguous axial images were obtained from the base of the skull through the vertex without intravenous contrast. COMPARISON:  None. FINDINGS: Brain: No evidence  of acute infarction,  hemorrhage, hydrocephalus, extra-axial collection or mass lesion/mass effect. Vascular: No hyperdense vessel or unexpected calcification. Skull: Normal. Negative for fracture or focal lesion. Sinuses/Orbits: Left maxillary sinusitis with fluid level. The other visualized paranasal sinuses are clear. Negative orbits IMPRESSION: 1. Negative intracranial imaging. 2. Left maxillary sinusitis with acute features. Electronically Signed   By: Marnee SpringJonathon  Watts M.D.   On: 01/26/2018 10:16    EKG: Orders placed or performed during the hospital encounter of 01/26/18  . EKG 12-Lead  . EKG 12-Lead    IMPRESSION AND PLAN: Patient is 62 year old female with medical history of anxiety, hypertension presents to the ER with visual deficit  1.  TIA Seen by neurology Continue aspirin CTA of the head and neck MRI of the brain Aspirin therapy Lipitor 40 Lipid panel in the a.m. PT evaluation Passed her swallow eval  2.  Chest pain We will check serial cardiac enzymes Obtain a Myoview in the morning Aspirin therapy  3.  Essential hypertension resume her HCTZ patient states that she ran out of this medication  4.  Cough congestion continue Flonase and Allegra Use antitussive  5.  Miscellaneous Lovenox for DVT prophylaxis       All the records are reviewed and case discussed with ED provider. Management plans discussed with the patient, family and they are in agreement.  CODE STATUS:    Code Status Orders  (From admission, onward)        Start     Ordered   01/26/18 1147  Full code  Continuous     01/26/18 1149    Code Status History    Date Active Date Inactive Code Status Order ID Comments User Context   This patient has a current code status but no historical code status.       TOTAL TIME TAKING CARE OF THIS PATIENT: 55minutes.    Auburn BilberryShreyang Hermenia Fritcher M.D on 01/26/2018 at 11:50 AM  Between 7am to 6pm - Pager - [phone]  After 6pm go to [email or web address] - password [email or web address]  [facility]  [facility]  Office  [phone]  CC: Primary care physician; [facility]

## 2018-01-26 NOTE — Progress Notes (Signed)
Initial Nutrition Assessment  DOCUMENTATION CODES:   Obesity unspecified  INTERVENTION:  Premier Protein daily, each supplement provides 160 calories and 30 gm protein  NUTRITION DIAGNOSIS:   Inadequate oral intake related to acute illness, decreased appetite as evidenced by per patient/family report, percent weight loss.  GOAL:   Patient will meet greater than or equal to 90% of their needs  MONITOR:   PO intake, I & O's, Labs, Weight trends, Skin  REASON FOR ASSESSMENT:   Malnutrition Screening Tool    ASSESSMENT:   Patient is 62 year old female with medical history of anxiety, hypertension presents to the ER with visual deficit, TIA, Chest Pain  Spoke with patient at bedside. She reports poor appetite over the past month due to ongoing cold and cough, recently had the flu, has been on antibiotics all of February. During this time, patient lost 8 pounds. Has been eating either a biscuit with jelly, cereal, or belvita bars for breakfast, a few bites of a meat and vegetable from "country kitchen," style restaurant for lunch, and may finish for dinner or order a separate meal. States she normally eats a little better for dinner. Ate some chicken for lunch but did not want vegetables provided to her.  Labs reviewed Medications reviewed   NUTRITION - FOCUSED PHYSICAL EXAM:    Most Recent Value  Orbital Region  No depletion  Upper Arm Region  No depletion  Thoracic and Lumbar Region  No depletion  Buccal Region  No depletion  Temple Region  No depletion  Clavicle Bone Region  No depletion  Clavicle and Acromion Bone Region  No depletion  Scapular Bone Region  No depletion  Dorsal Hand  No depletion  Patellar Region  No depletion  Anterior Thigh Region  No depletion  Posterior Calf Region  No depletion  Edema (RD Assessment)  None  Hair  Reviewed  Eyes  Reviewed  Mouth  Reviewed  Skin  Reviewed  Nails  Reviewed       Diet Order:  Fall precautions Diet Heart  Room service appropriate? Yes; Fluid consistency: Thin  EDUCATION NEEDS:   No education needs have been identified at this time  Skin:  Skin Assessment: Reviewed RN Assessment  Last BM:  01/26/2018  Height:   Ht Readings from Last 1 Encounters:  01/26/18 5\' 3"  (1.6 m)    Weight:   Wt Readings from Last 1 Encounters:  01/26/18 190 lb (86.2 kg)    Ideal Body Weight:  52.27 kg  BMI:  Body mass index is 33.66 kg/m.  Estimated Nutritional Needs:   Kcal:  1680-1820 calories (MSJ x1.2-1.3)  Protein:  103-121 grams (1.2-1.4g/kg)  Fluid:  1.7-1.8L  Dionne AnoWilliam M. Gean Larose, MS, RD LDN Inpatient Clinical Dietitian Pager [phone]

## 2018-01-26 NOTE — ED Notes (Signed)
Patient transported to CT 

## 2018-01-26 NOTE — Progress Notes (Signed)
*  PRELIMINARY RESULTS* Echocardiogram 2D Echocardiogram has been performed.  Joanette GulaJoan M Landers Prajapati 01/26/2018, 2:33 PM

## 2018-01-26 NOTE — Consult Note (Signed)
Referring Physician: Paduchowski    Chief Complaint: Left visual field loss  HPI: Tonya Hopkins is an 62 y.o. female with a history of HTN, not well controlled on medications who reports that she awakened today at baseline.  While driving to work had acute onset of left visual field loss.  Symptoms lasted about 30 minutes then resolved spontaneously.  Patient now with headache that she describes as holocranial and at an intensity of 7/10.  Patient reports that she has recently been diagnosed with sinus infection, flu and has had a lot of headaches.   Initial NIHSS of 0.    Date last known well: Date: 01/26/2018 Time last known well: Time: 08:15 tPA Given: No: Resolution of symptoms  Past Medical History:  Diagnosis Date  . Anxiety   . Headache, frequent episodic tension-type   . Hypertension    without treatment  . OA (osteoarthritis) of hip   . Recurrent urinary tract infection     Past Surgical History:  Procedure Laterality Date  . APPENDECTOMY    . BREAST CYST ASPIRATION Right   . TONSILLECTOMY AND ADENOIDECTOMY  1971    Family History  Problem Relation Age of Onset  . Osteoporosis Mother   . Heart disease Father   . Hypertension Father   . Osteoporosis Sister   . Hypertension Brother   . Diabetes Brother   . Stroke Brother 5  . Breast cancer Neg Hx    Social History:  reports that  has never smoked. she has never used smokeless tobacco. She reports that she does not drink alcohol or use drugs.  Allergies: No Known Allergies  Medications: I have reviewed the patient's current medications. Prior to Admission:  Prior to Admission medications   Medication Sig Start Date End Date Taking? Authorizing Provider  fexofenadine-pseudoephedrine (ALLEGRA-D 24) 180-240 MG 24 hr tablet Take 1 tablet by mouth daily.   Yes [provider]  hydrochlorothiazide (HYDRODIURIL) 25 MG tablet Take 1 tablet (25 mg total) by mouth daily. 03/13/13  Yes Sherlene Shams, MD   promethazine-dextromethorphan (PROMETHAZINE-DM) 6.25-15 MG/5ML syrup Take 5 mLs by mouth every 6 (six) hours as needed. 01/13/18  Yes [provider]  escitalopram (LEXAPRO) 5 MG tablet Take 1 tablet (5 mg total) by mouth daily. Patient not taking: Reported on 01/26/2018 01/17/13   Sherlene Shams, MD  omeprazole (PRILOSEC) 20 MG capsule Take 1 capsule (20 mg total) by mouth daily. Patient not taking: Reported on 01/26/2018 01/02/13   Sherlene Shams, MD  traMADol Janean Sark) 50 MG tablet 1 or 2 tablets every 8 hours as needed for pain Patient not taking: Reported on 01/26/2018 01/03/13   Sherlene Shams, MD  zolpidem (AMBIEN) 5 MG tablet Take 1 tablet (5 mg total) by mouth at bedtime as needed for sleep. Patient not taking: Reported on 01/26/2018 01/02/13   Sherlene Shams, MD    ROS: History obtained from the patient  General ROS: negative for - chills, fatigue, fever, night sweats, weight gain or weight loss Psychological ROS: negative for - behavioral disorder, hallucinations, memory difficulties, mood swings or suicidal ideation Ophthalmic ROS: as noted in HPI ENT ROS: ear fullness Allergy and Immunology ROS: negative for - hives or itchy/watery eyes Hematological and Lymphatic ROS: negative for - bleeding problems, bruising or swollen lymph nodes Endocrine ROS: negative for - galactorrhea, hair pattern changes, polydipsia/polyuria or temperature intolerance Respiratory ROS: congestion Cardiovascular ROS: negative for - chest pain, dyspnea on exertion, edema or irregular heartbeat  Gastrointestinal ROS: negative for - abdominal pain, diarrhea, hematemesis, nausea/vomiting or stool incontinence Genito-Urinary ROS: negative for - dysuria, hematuria, incontinence or urinary frequency/urgency Musculoskeletal ROS: negative for - joint swelling or muscular weakness Neurological ROS: as noted in HPI Dermatological ROS: negative for rash and skin lesion changes  Physical Examination: Blood pressure  (!) 152/92, pulse 93, temperature 98.1 F (36.7 C), temperature source Oral, resp. rate 18, height 5\' 3"  (1.6 m), weight 86.2 kg (190 lb), SpO2 96 %.  HEENT-  Normocephalic, no lesions, without obvious abnormality.  Normal external eye and conjunctiva.  Normal TM's bilaterally.  Normal auditory canals and external ears. Normal external nose, mucus membranes and septum.  Normal pharynx. Cardiovascular- S1, S2 normal, pulses palpable throughout   Lungs- chest clear, no wheezing, rales, normal symmetric air entry Abdomen- soft, non-tender; bowel sounds normal; no masses,  no organomegaly Extremities- no edema Lymph-no adenopathy palpable Musculoskeletal-no joint tenderness, deformity or swelling Skin-warm and dry, no hyperpigmentation, vitiligo, or suspicious lesions  Neurological Examination   Mental Status: Alert, oriented, thought content appropriate.  Speech fluent without evidence of aphasia.  Able to follow 3 step commands without difficulty. Cranial Nerves: II: Discs flat bilaterally; Visual fields grossly normal, pupils equal, round, reactive to light and accommodation III,IV, VI: ptosis not present, extra-ocular motions intact bilaterally V,VII: smile symmetric, facial light touch sensation decreased on the left VIII: hearing normal bilaterally IX,X: gag reflex present XI: bilateral shoulder shrug XII: midline tongue extension Motor: Right : Upper extremity   5/5    Left:     Upper extremity   5/5  Lower extremity   5/5     Lower extremity   5/5 Tone and bulk:normal tone throughout; no atrophy noted Sensory: Pinprick and light touch intact throughout, bilaterally Deep Tendon Reflexes: 2+ and symmetric with absent AJ's bilaterally Plantars: Right: downgoing   Left: downgoing Cerebellar: Normal finger-to-nose and normal heel-to-shin testing bilaterally Gait: not tested due to safety concerns   Laboratory Studies:  Basic Metabolic Panel: Recent Labs  Lab 01/26/18 0937  NA  136  K 3.9  CL 103  CO2 25  GLUCOSE 93  BUN 13  CREATININE 0.83  CALCIUM 9.8    Liver Function Tests: Recent Labs  Lab 01/26/18 0937  AST 34  ALT 40  ALKPHOS 55  BILITOT 0.7  PROT 8.0  ALBUMIN 4.4   No results for input(s): LIPASE, AMYLASE in the last 168 hours. No results for input(s): AMMONIA in the last 168 hours.  CBC: Recent Labs  Lab 01/26/18 0937  WBC 7.5  HGB 15.7  HCT 45.8  MCV 88.6  PLT 207    Cardiac Enzymes: Recent Labs  Lab 01/26/18 0937  TROPONINI <0.03    BNP: Invalid input(s): POCBNP  CBG: No results for input(s): GLUCAP in the last 168 hours.  Microbiology: No results found for this or any previous visit.  Coagulation Studies: Recent Labs    01/26/18 0937  LABPROT 12.2  INR 0.91    Urinalysis: No results for input(s): COLORURINE, LABSPEC, PHURINE, GLUCOSEU, HGBUR, BILIRUBINUR, KETONESUR, PROTEINUR, UROBILINOGEN, NITRITE, LEUKOCYTESUR in the last 168 hours.  Invalid input(s): APPERANCEUR  Lipid Panel:    Component Value Date/Time   CHOL 205 (H) 01/17/2013 1048   TRIG 114.0 01/17/2013 1048   HDL 47.00 01/17/2013 1048   CHOLHDL 4 01/17/2013 1048   VLDL 22.8 01/17/2013 1048    HgbA1C: No results found for: HGBA1C  Urine Drug Screen:  No results found for: LABOPIA, COCAINSCRNUR, LABBENZ,  AMPHETMU, THCU, LABBARB  Alcohol Level: No results for input(s): ETH in the last 168 hours.  Other results: EKG: normal sinus rhythm at 94 bpm.  Imaging: Dg Chest 2 View  Result Date: 01/26/2018 CLINICAL DATA:  Patient reports blurred vision, central CP, SOB and headache onset today.Denies fever or cough. Hx HTN. Non-smoker. EXAM: CHEST - 2 VIEW COMPARISON:  09/01/2014 FINDINGS: Lungs are clear. Heart size and mediastinal contours are within normal limits. No effusion.  No pneumothorax. Visualized bones unremarkable. IMPRESSION: No acute cardiopulmonary disease. Electronically Signed   By: Corlis Leak M.D.   On: 01/26/2018 10:30   Ct Head  Wo Contrast  Result Date: 01/26/2018 CLINICAL DATA:  Visual loss. Patient woke up with headache and nausea. EXAM: CT HEAD WITHOUT CONTRAST TECHNIQUE: Contiguous axial images were obtained from the base of the skull through the vertex without intravenous contrast. COMPARISON:  None. FINDINGS: Brain: No evidence of acute infarction, hemorrhage, hydrocephalus, extra-axial collection or mass lesion/mass effect. Vascular: No hyperdense vessel or unexpected calcification. Skull: Normal. Negative for fracture or focal lesion. Sinuses/Orbits: Left maxillary sinusitis with fluid level. The other visualized paranasal sinuses are clear. Negative orbits IMPRESSION: 1. Negative intracranial imaging. 2. Left maxillary sinusitis with acute features. Electronically Signed   By: Marnee Spring M.D.   On: 01/26/2018 10:16    Assessment: 62 y.o. female presenting after an episode of left visual field loss.  Now only complains of left face numbness.  Head CT reviewed and shows no acute changes.  On no antiplatelet therapy prior to admission.  Further work up recommended.    Stroke Risk Factors - hypertension  Plan: 1. HgbA1c, fasting lipid panel 2. MRI of the brain without contrast 3. PT consult, OT consult, Speech consult 4. Echocardiogram 5. Would perform CTA of the head and neck since posterior circulation is of most concern and not well visualized with carotid dopplers.   6. Prophylactic therapy-Antiplatelet med: Aspirin - dose 325mg  daily 7. NPO until RN stroke swallow screen 8. Telemetry monitoring 9. Frequent neuro checks   Thana Farr, MD Neurology [phone] 01/26/2018, 11:24 AM

## 2018-01-26 NOTE — ED Notes (Signed)
Attempted to call report.  Per unit, bed not assigned. Call back in 5 minutes.

## 2018-01-26 NOTE — ED Notes (Signed)
Attempted to call report- RN not available at this time.

## 2018-01-27 ENCOUNTER — Observation Stay: Payer: PRIVATE HEALTH INSURANCE

## 2018-01-27 DIAGNOSIS — G459 Transient cerebral ischemic attack, unspecified: Secondary | ICD-10-CM | POA: Diagnosis not present

## 2018-01-27 LAB — NM MYOCAR MULTI W/SPECT W/WALL MOTION / EF
CHL CUP NUCLEAR SRS: 0
CHL CUP RESTING HR STRESS: 81 {beats}/min
CSEPEW: 1 METS
CSEPPHR: 111 {beats}/min
Exercise duration (min): 1 min
Exercise duration (sec): 1 s
LV dias vol: 41 mL (ref 46–106)
LV sys vol: 15 mL
SDS: 0
SSS: 0
TID: 1.11

## 2018-01-27 LAB — LIPID PANEL
Cholesterol: 217 mg/dL — ABNORMAL HIGH (ref 0–200)
HDL: 53 mg/dL (ref >40)
LDL CALC: 133 mg/dL — AB (ref 0–99)
TRIGLYCERIDES: 154 mg/dL — AB (ref <150)
Total CHOL/HDL Ratio: 4.1 RATIO
VLDL: 31 mg/dL (ref 0–40)

## 2018-01-27 LAB — HEMOGLOBIN A1C
Hgb A1c MFr Bld: 5.1 % (ref 4.8–5.6)
Mean Plasma Glucose: 99.67 mg/dL

## 2018-01-27 MED ORDER — ASPIRIN EC 81 MG PO TBEC: 81.0000 mg | DELAYED_RELEASE_TABLET | Freq: Every day | ORAL | 2 refills | Status: AC

## 2018-01-27 MED ORDER — TECHNETIUM TC 99M TETROFOSMIN IV KIT
30.8800 | PACK | Freq: Once | INTRAVENOUS | Status: AC | PRN
  Administered 2018-01-27: 30.88 via INTRAVENOUS

## 2018-01-27 MED ORDER — HYDROCHLOROTHIAZIDE 25 MG PO TABS: 25.0000 mg | ORAL_TABLET | Freq: Every day | ORAL | 5 refills | Status: AC

## 2018-01-27 MED ORDER — TECHNETIUM TC 99M TETROFOSMIN IV KIT
13.8700 | PACK | Freq: Once | INTRAVENOUS | Status: AC | PRN
  Administered 2018-01-27: 13.87 via INTRAVENOUS

## 2018-01-27 MED ORDER — REGADENOSON 0.4 MG/5ML IV SOLN
0.4000 mg | Freq: Once | INTRAVENOUS | Status: AC
  Administered 2018-01-27: 0.4 mg via INTRAVENOUS

## 2018-01-27 MED ORDER — ATORVASTATIN CALCIUM 20 MG PO TABS: 20.0000 mg | ORAL_TABLET | Freq: Every day | ORAL | 11 refills | Status: AC

## 2018-01-27 NOTE — Progress Notes (Signed)
Discharge instructions along with home medications and follow up gone over with patient. She verbalized that she understood instructions. No prescriptions given to patient. IV and tele removed. Pt being discharged home on room air, no distress noted. Yanai Hobson S Fenton, RN 

## 2018-01-27 NOTE — Progress Notes (Signed)
OT Cancellation Note  Patient Details Name: Tonya Hopkins MRN: [id number] DOB: 08/01/1956   Cancelled Treatment:    Reason Eval/Treat Not Completed: (Pt out of room at [facility] )  Oletta CohnuPreez, Temia Debroux OTR/L,CLT 01/27/2018, 11:29 AM

## 2018-01-27 NOTE — Progress Notes (Signed)
Referring Physician: Paduchowski    Chief Complaint: Left visual field loss  Interval history 01/27/2018: Her vision changes are resolved. She has a headache and has had a dull headache for several weeks due to sinus problems and started Allegra-D. She couldn't see anything on the left was grey, unclear if one eye or both. No migrainous features. Work has been very stressful. Lasted 20-30 minutes. Not on aspirin at home.   HPI: Tonya Hopkins is an 62 y.o. female with a history of HTN, not well controlled on medications who reports that she awakened today at baseline.  While driving to work had acute onset of left visual field loss.  Symptoms lasted about 30 minutes then resolved spontaneously.  Patient now with headache that she describes as holocranial and at an intensity of 7/10.  Patient reports that she has recently been diagnosed with sinus infection, flu and has had a lot of headaches.   Initial NIHSS of 0.    Date last known well: Date: 01/26/2018 Time last known well: Time: 08:15 tPA Given: No: Resolution of symptoms  Past Medical History:  Diagnosis Date  . Anxiety   . Headache, frequent episodic tension-type   . Hypertension    without treatment  . OA (osteoarthritis) of hip   . Recurrent urinary tract infection     Past Surgical History:  Procedure Laterality Date  . APPENDECTOMY    . BREAST CYST ASPIRATION Right   . TONSILLECTOMY AND ADENOIDECTOMY  1971    Family History  Problem Relation Age of Onset  . Osteoporosis Mother   . Heart disease Father   . Hypertension Father   . Osteoporosis Sister   . Hypertension Brother   . Diabetes Brother   . Stroke Brother 55  . Breast cancer Neg Hx    Social History:  reports that  has never smoked. she has never used smokeless tobacco. She reports that she does not drink alcohol or use drugs.  Allergies: No Known Allergies  Medications: I have reviewed the patient's current medications. Prior to Admission:  Prior to  Admission medications   Medication Sig Start Date End Date Taking? Authorizing Provider  fexofenadine-pseudoephedrine (ALLEGRA-D 24) 180-240 MG 24 hr tablet Take 1 tablet by mouth daily.   Yes [provider]  hydrochlorothiazide (HYDRODIURIL) 25 MG tablet Take 1 tablet (25 mg total) by mouth daily. 03/13/13  Yes Sherlene Shams, MD  promethazine-dextromethorphan (PROMETHAZINE-DM) 6.25-15 MG/5ML syrup Take 5 mLs by mouth every 6 (six) hours as needed. 01/13/18  Yes [provider]  escitalopram (LEXAPRO) 5 MG tablet Take 1 tablet (5 mg total) by mouth daily. Patient not taking: Reported on 01/26/2018 01/17/13   Sherlene Shams, MD  omeprazole (PRILOSEC) 20 MG capsule Take 1 capsule (20 mg total) by mouth daily. Patient not taking: Reported on 01/26/2018 01/02/13   Sherlene Shams, MD  traMADol Janean Sark) 50 MG tablet 1 or 2 tablets every 8 hours as needed for pain Patient not taking: Reported on 01/26/2018 01/03/13   Sherlene Shams, MD  zolpidem (AMBIEN) 5 MG tablet Take 1 tablet (5 mg total) by mouth at bedtime as needed for sleep. Patient not taking: Reported on 01/26/2018 01/02/13   Sherlene Shams, MD    ROS: History obtained from the patient  General ROS: negative for - chills, fatigue, fever, night sweats, weight gain or weight loss Psychological ROS: negative for - behavioral disorder, hallucinations, memory difficulties, mood swings or suicidal ideation Ophthalmic ROS: as noted  in HPI ENT ROS: ear fullness Allergy and Immunology ROS: negative for - hives or itchy/watery eyes Hematological and Lymphatic ROS: negative for - bleeding problems, bruising or swollen lymph nodes Endocrine ROS: negative for - galactorrhea, hair pattern changes, polydipsia/polyuria or temperature intolerance Respiratory ROS: congestion Cardiovascular ROS: negative for - chest pain, dyspnea on exertion, edema or irregular heartbeat Gastrointestinal ROS: negative for - abdominal pain, diarrhea, hematemesis,  nausea/vomiting or stool incontinence Genito-Urinary ROS: negative for - dysuria, hematuria, incontinence or urinary frequency/urgency Musculoskeletal ROS: negative for - joint swelling or muscular weakness Neurological ROS: as noted in HPI Dermatological ROS: negative for rash and skin lesion changes  Physical Examination: Blood pressure (!) 149/83, pulse 76, temperature (!) 97.5 F (36.4 C), temperature source Oral, resp. rate 16, height 5\' 3"  (1.6 m), weight 190 lb (86.2 kg), SpO2 96 %.  HEENT-  Normocephalic, no lesions, without obvious abnormality.  Normal external eye and conjunctiva.  Normal TM's bilaterally.  Normal auditory canals and external ears. Normal external nose, mucus membranes and septum.  Normal pharynx. Cardiovascular- S1, S2 normal, pulses palpable throughout   Lungs- chest clear, no wheezing, rales, normal symmetric air entry Abdomen- soft, non-tender; bowel sounds normal; no masses,  no organomegaly Extremities- no edema Lymph-no adenopathy palpable Musculoskeletal-no joint tenderness, deformity or swelling Skin-warm and dry, no hyperpigmentation, vitiligo, or suspicious lesions   Physical exam: Exam: Gen: NAD, conversant, well nourised, obese, well groomed                     CV: RRR, no MRG. No Carotid Bruits. No peripheral edema, warm, nontender Eyes: Conjunctivae clear without exudates or hemorrhage  Neuro: Detailed Neurologic Exam  Speech:    Speech is normal; fluent and spontaneous with normal comprehension.  Cognition:    The patient is oriented to person, place, and time;     recent and remote memory intact;     language fluent;     normal attention, concentration,     fund of knowledge Cranial Nerves:    The pupils are equal, round, and reactive to light.  Visual fields are full to finger confrontation. Extraocular movements are intact. Trigeminal sensation is intact and the muscles of mastication are normal. The face is symmetric. The palate  elevates in the midline. Hearing intact. Voice is normal. Shoulder shrug is normal. The tongue has normal motion without fasciculations.   Coordination:    No dysmetrial.   Motor Observation:    No asymmetry, no atrophy, and no involuntary movements noted. Tone:    Normal muscle tone.    Posture:    Posture is normal. normal erect    Strength:    Strength is V/V in the upper and lower limbs.      Sensation: intact to LT     Reflex Exam:  DTR's:    Deep tendon reflexes in the upper and lower extremities are symmetrical bilaterally.   Toes:    The toes are downgoing bilaterally.   Clonus:    Clonus is absent.  Laboratory Studies:  Basic Metabolic Panel: Recent Labs  Lab 01/26/18 0937  NA 136  K 3.9  CL 103  CO2 25  GLUCOSE 93  BUN 13  CREATININE 0.83  CALCIUM 9.8    Liver Function Tests: Recent Labs  Lab 01/26/18 0937  AST 34  ALT 40  ALKPHOS 55  BILITOT 0.7  PROT 8.0  ALBUMIN 4.4   No results for input(s): LIPASE, AMYLASE in the last 168  hours. No results for input(s): AMMONIA in the last 168 hours.  CBC: Recent Labs  Lab 01/26/18 0937  WBC 7.5  HGB 15.7  HCT 45.8  MCV 88.6  PLT 207    Cardiac Enzymes: Recent Labs  Lab 01/26/18 0937 01/26/18 1553 01/26/18 2157  TROPONINI <0.03 <0.03 <0.03    BNP: Invalid input(s): POCBNP  CBG: No results for input(s): GLUCAP in the last 168 hours.  Microbiology: No results found for this or any previous visit.  Coagulation Studies: Recent Labs    01/26/18 0937  LABPROT 12.2  INR 0.91    Urinalysis:  Recent Labs  Lab 01/26/18 0952  COLORURINE STRAW*  LABSPEC 1.040*  PHURINE 7.0  GLUCOSEU NEGATIVE  HGBUR NEGATIVE  BILIRUBINUR NEGATIVE  KETONESUR NEGATIVE  PROTEINUR NEGATIVE  NITRITE NEGATIVE  LEUKOCYTESUR NEGATIVE    Lipid Panel:    Component Value Date/Time   CHOL 217 (H) 01/27/2018 0343   TRIG 154 (H) 01/27/2018 0343   HDL 53 01/27/2018 0343   CHOLHDL 4.1 01/27/2018 0343    VLDL 31 01/27/2018 0343   LDLCALC 133 (H) 01/27/2018 0343    HgbA1C:  Lab Results  Component Value Date   HGBA1C 5.1 01/27/2018    Urine Drug Screen:      Component Value Date/Time   LABOPIA NONE DETECTED 01/26/2018 1542   COCAINSCRNUR NONE DETECTED 01/26/2018 1542   LABBENZ NONE DETECTED 01/26/2018 1542   AMPHETMU NONE DETECTED 01/26/2018 1542   THCU NONE DETECTED 01/26/2018 1542   LABBARB NONE DETECTED 01/26/2018 1542    Alcohol Level: No results for input(s): ETH in the last 168 hours.  Other results: EKG: normal sinus rhythm at 94 bpm.  Imaging: Ct Angio Head W Or Wo Contrast  Result Date: 01/26/2018 CLINICAL DATA:  Left visual field defect for a few minutes this morning. EXAM: CT ANGIOGRAPHY HEAD AND NECK TECHNIQUE: Multidetector CT imaging of the head and neck was performed using the standard protocol during bolus administration of intravenous contrast. Multiplanar CT image reconstructions and MIPs were obtained to evaluate the vascular anatomy. Carotid stenosis measurements (when applicable) are obtained utilizing NASCET criteria, using the distal internal carotid diameter as the denominator. CONTRAST:  75mL ISOVUE-370 IOPAMIDOL (ISOVUE-370) INJECTION 76% COMPARISON:  Noncontrast head CT earlier today. FINDINGS: CTA NECK FINDINGS Aortic arch: Normal.  Three vessel branching. Right carotid system: Smooth and widely patent. No atheromatous changes. Vessel tortuosity. Left carotid system: Smooth and widely patent. No atheromatous changes. Vessel tortuosity. Vertebral arteries: No proximal subclavian stenosis or atherosclerosis. Left dominant vertebral artery. No stenosis or beading. Bilateral vertebral tortuosity within the transverse foramina. Skeleton: Negative Other neck: Left maxillary sinusitis as previously described. Upper chest: Negative Review of the MIP images confirms the above findings CTA HEAD FINDINGS Anterior circulation: No atheromatous changes, branch occlusion,  stenosis, or aneurysm. Posterior circulation: Mild left vertebral artery dominance. Vertebral and basilar arteries are smooth and diffusely patent. There is diminished visualization of bilateral PCAs due to venous contamination. Hypoplastic right P1 segment with robust posterior communicating artery. Venous sinuses: Diffusely patent. Anatomic variants: As above Delayed phase: No abnormal intracranial enhancement. Review of the MIP images confirms the above findings IMPRESSION: Negative CTA of the head and neck. No stenosis, atherosclerosis, or embolic source identified. Electronically Signed   By: Marnee Spring M.D.   On: 01/26/2018 12:49   Dg Chest 2 View  Result Date: 01/26/2018 CLINICAL DATA:  Patient reports blurred vision, central CP, SOB and headache onset today.Denies fever or cough. Hx HTN.  Non-smoker. EXAM: CHEST - 2 VIEW COMPARISON:  09/01/2014 FINDINGS: Lungs are clear. Heart size and mediastinal contours are within normal limits. No effusion.  No pneumothorax. Visualized bones unremarkable. IMPRESSION: No acute cardiopulmonary disease. Electronically Signed   By: Corlis Leak M.D.   On: 01/26/2018 10:30   Ct Head Wo Contrast  Result Date: 01/26/2018 CLINICAL DATA:  Visual loss. Patient woke up with headache and nausea. EXAM: CT HEAD WITHOUT CONTRAST TECHNIQUE: Contiguous axial images were obtained from the base of the skull through the vertex without intravenous contrast. COMPARISON:  None. FINDINGS: Brain: No evidence of acute infarction, hemorrhage, hydrocephalus, extra-axial collection or mass lesion/mass effect. Vascular: No hyperdense vessel or unexpected calcification. Skull: Normal. Negative for fracture or focal lesion. Sinuses/Orbits: Left maxillary sinusitis with fluid level. The other visualized paranasal sinuses are clear. Negative orbits IMPRESSION: 1. Negative intracranial imaging. 2. Left maxillary sinusitis with acute features. Electronically Signed   By: Marnee Spring M.D.   On:  01/26/2018 10:16   Ct Angio Neck W Or Wo Contrast  Result Date: 01/26/2018 CLINICAL DATA:  Left visual field defect for a few minutes this morning. EXAM: CT ANGIOGRAPHY HEAD AND NECK TECHNIQUE: Multidetector CT imaging of the head and neck was performed using the standard protocol during bolus administration of intravenous contrast. Multiplanar CT image reconstructions and MIPs were obtained to evaluate the vascular anatomy. Carotid stenosis measurements (when applicable) are obtained utilizing NASCET criteria, using the distal internal carotid diameter as the denominator. CONTRAST:  75mL ISOVUE-370 IOPAMIDOL (ISOVUE-370) INJECTION 76% COMPARISON:  Noncontrast head CT earlier today. FINDINGS: CTA NECK FINDINGS Aortic arch: Normal.  Three vessel branching. Right carotid system: Smooth and widely patent. No atheromatous changes. Vessel tortuosity. Left carotid system: Smooth and widely patent. No atheromatous changes. Vessel tortuosity. Vertebral arteries: No proximal subclavian stenosis or atherosclerosis. Left dominant vertebral artery. No stenosis or beading. Bilateral vertebral tortuosity within the transverse foramina. Skeleton: Negative Other neck: Left maxillary sinusitis as previously described. Upper chest: Negative Review of the MIP images confirms the above findings CTA HEAD FINDINGS Anterior circulation: No atheromatous changes, branch occlusion, stenosis, or aneurysm. Posterior circulation: Mild left vertebral artery dominance. Vertebral and basilar arteries are smooth and diffusely patent. There is diminished visualization of bilateral PCAs due to venous contamination. Hypoplastic right P1 segment with robust posterior communicating artery. Venous sinuses: Diffusely patent. Anatomic variants: As above Delayed phase: No abnormal intracranial enhancement. Review of the MIP images confirms the above findings IMPRESSION: Negative CTA of the head and neck. No stenosis, atherosclerosis, or embolic source  identified. Electronically Signed   By: Marnee Spring M.D.   On: 01/26/2018 12:49   Mr Brain Wo Contrast  Result Date: 01/26/2018 CLINICAL DATA:  Chest pressure, brief visual loss. Assess TIA. History of hypertension, headache. EXAM: MRI HEAD WITHOUT CONTRAST TECHNIQUE: Multiplanar, multiecho pulse sequences of the brain and surrounding structures were obtained without intravenous contrast. COMPARISON:  CT/CTA HEAD January 26, 2018 FINDINGS: INTRACRANIAL CONTENTS: No reduced diffusion to suggest acute ischemia. LEFT frontal lobe rounded susceptibility artifact and mild encephalomalacia corresponding to calcification on today's CT. The ventricles and sulci are normal for patient's age. A few scattered subcentimeter supratentorial white matter FLAIR T2 hyperintensities noted. No suspicious parenchymal signal, masses, mass effect. No abnormal extra-axial fluid collections. No extra-axial masses. VASCULAR: Normal major intracranial vascular flow voids present at skull base. SKULL AND UPPER CERVICAL SPINE: No abnormal sellar expansion. No suspicious calvarial bone marrow signal. Craniocervical junction maintained. SINUSES/ORBITS: Mild LEFT paranasal sinus mucosal thickening  with LEFT maxillary sinus air-fluid level. Mastoid air cells are well aerated. Included ocular globes and orbital contents are non-suspicious. OTHER: None. IMPRESSION: 1. No acute intracranial process. 2. 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 3. Mild chronic small vessel ischemic disease. 4. Acute LEFT paranasal sinusitis. Electronically Signed   By: Awilda Metroourtnay  Bloomer M.D.   On: 01/26/2018 13:35   Nm Myocar Multi W/spect W/wall Motion / Ef  Result Date: 01/27/2018  Blood pressure demonstrated a normal response to exercise.  There was no ST segment deviation noted during stress.  The study is normal.  This is a low risk study.  The left ventricular ejection fraction is hyperdynamic (>65%).      Assessment: 62 y.o. female presenting after an episode of left visual field loss.  Now only complains of chronic headache, not migrainous. Workup is negative, likely TIA. On no antiplatelet therapy prior to admission.    Stroke Risk Factors - hypertension  Plan: 1. HgbA1c 5.1 , fasting lipid panel ldl 133 start lipitor 2. MRI of the brain without contrast without acute event 3. PT consult, OT consult, Speech consult 4. Echocardiogram without thrombus or pfo 5. CTA head/neck unremarkable. No need for carotid dopplers. .  6. Prophylactic therapy-Antiplatelet med: Aspirin - dose 325mg  daily, continue outpatient 7. Discussed compliance 8. Telemetry monitoring 9. Frequent neuro checks  Stroke team will sign off at this time  I had a long d/w patient about her TIA, risk for future stroke/TIAs, personally independently reviewed imaging studies and stroke evaluation results and answered questions. ASA 325mg  for secondary stroke prevention and maintain strict control of hypertension with blood pressure goal below 130/90, diabetes with hemoglobin A1c goal below 6.5% and lipids with LDL cholesterol goal below 70 mg/dL. I also advised the patient to eat a healthy diet with plenty of whole grains, cereals, fruits and vegetables, exercise regularly and maintain ideal body weight .Followup in the future with neurology in 12 weeks or earlier if necessary.  Personally examined patient and images, and have participated in and made any corrections needed to history, physical, neuro exam,assessment and plan as stated above.  I have personally obtained the history, evaluated lab date, reviewed imaging studies and agree with radiology interpretations.    Naomie DeanAntonia Demarea Lorey, MD

## 2018-01-27 NOTE — Plan of Care (Signed)
Pt had a headache once during shift. NIH 0. No other signs of distress noted. Will continue to monitor.

## 2018-01-27 NOTE — Discharge Summary (Signed)
[facility] [place] [facility]   PATIENT NAME: Tonya Hopkins    MR#:  [id number]  DATE OF BIRTH:  Mar 11, 1956  DATE OF ADMISSION:  01/26/2018 ADMITTING PHYSICIAN: Auburn Bilberry, MD  DATE OF DISCHARGE: 01/27/2018   PRIMARY CARE PHYSICIAN: [facility], [facility]    ADMISSION DIAGNOSIS:  TIA (transient ischemic attack) [G45.9]  DISCHARGE DIAGNOSIS:  Active Problems:   TIA (transient ischemic attack)   SECONDARY DIAGNOSIS:   Past Medical History:  Diagnosis Date  . Anxiety   . Headache, frequent episodic tension-type   . Hypertension    without treatment  . OA (osteoarthritis) of hip   . Recurrent urinary tract infection     HOSPITAL COURSE:  1.  TIA Seen by neurology  HgbA1c 5.1 , fasting lipid panel ldl 133 start lipitor  MRI of the brain without contrast without acute event  PT consult, OT consult, Speech consult  Echocardiogram without thrombus or pfo  CTA head/neck unremarkable. No need for carotid dopplers. Marland Kitchen   Prophylactic therapy-Antiplatelet med: Aspirin , follow with PMD.  2.  Chest pain  check serial cardiac enzymes- stable. NM myoview negative for ischemia- d.c home.  3.  Essential hypertension resume her HCTZ patient states that she ran out of this medication  4.  Cough congestion continue Flonase and Allegra Use antitussive  5.  Miscellaneous Lovenox for DVT prophylaxis    DISCHARGE CONDITIONS:   Stable.  CONSULTS OBTAINED:    DRUG ALLERGIES:  No Known Allergies  DISCHARGE MEDICATIONS:   Allergies as of 01/27/2018   No Known Allergies     Medication List    STOP taking these medications   zolpidem 5 MG tablet Commonly known as:  AMBIEN     TAKE these medications   aspirin EC 81 MG tablet Take 1 tablet (81 mg total) by mouth daily.   atorvastatin 20 MG tablet Commonly known as:  LIPITOR Take 1 tablet (20 mg total) by mouth daily.   escitalopram 5 MG tablet Commonly known as:  LEXAPRO Take 1  tablet (5 mg total) by mouth daily.   fexofenadine-pseudoephedrine 180-240 MG 24 hr tablet Commonly known as:  ALLEGRA-D 24 Take 1 tablet by mouth daily.   hydrochlorothiazide 25 MG tablet Commonly known as:  HYDRODIURIL Take 1 tablet (25 mg total) by mouth daily.   omeprazole 20 MG capsule Commonly known as:  PRILOSEC Take 1 capsule (20 mg total) by mouth daily.   promethazine-dextromethorphan 6.25-15 MG/5ML syrup Commonly known as:  PROMETHAZINE-DM Take 5 mLs by mouth every 6 (six) hours as needed.   traMADol 50 MG tablet Commonly known as:  ULTRAM 1 or 2 tablets every 8 hours as needed for pain        DISCHARGE INSTRUCTIONS:    Follow with PMD.  If you experience worsening of your admission symptoms, develop shortness of breath, life threatening emergency, suicidal or homicidal thoughts you must seek medical attention immediately by calling 911 or calling your MD immediately  if symptoms less severe.  You Must read complete instructions/literature along with all the possible adverse reactions/side effects for all the Medicines you take and that have been prescribed to you. Take any new Medicines after you have completely understood and accept all the possible adverse reactions/side effects.   Please note  You were cared for by a hospitalist during your hospital stay. If you have any questions about your discharge medications or the care you received while you were in the hospital after  you are discharged, you can call the unit and asked to speak with the hospitalist on call if the hospitalist that took care of you is not available. Once you are discharged, your primary care physician will handle any further medical issues. Please note that NO REFILLS for any discharge medications will be authorized once you are discharged, as it is imperative that you return to your primary care physician (or establish a relationship with a primary care physician if you do not have one) for  your aftercare needs so that they can reassess your need for medications and monitor your lab values.    Today   CHIEF COMPLAINT:   Chief Complaint  Patient presents with  . Chest Pain    HISTORY OF PRESENT ILLNESS:  Tonya Hopkins  is a 62 y.o. female with a known history of essential hypertension, osteoarthritis who is presenting to the ED with complaint of chest pressure as well as brief visual loss.  Patient states that around 810 this morning she had visual deficit everything went gray for about 20-30 minutes.  Now her vision is return back to normal.  CT scan of the head is negative.  Patient has never had these symptoms before.  She otherwise denies any weakness or difficulty swallowing.  Patient also reports that recently she was diagnosed with the flu last month and was treated for Tamiflu.  She continues to have some cough and congestion.  She states that she started having chest pressure since last night like someone was sitting on her chest.  She has not experienced this type of symptoms before.  It is unrelated to activity.   VITAL SIGNS:  Blood pressure 140/69, pulse 90, temperature 98.1 F (36.7 C), temperature source Oral, resp. rate 20, height 5\' 3"  (1.6 m), weight 86.2 kg (190 lb), SpO2 92 %.  I/O:    Intake/Output Summary (Last 24 hours) at 01/27/2018 1545 Last data filed at 01/27/2018 1300 Gross per 24 hour  Intake 480 ml  Output -  Net 480 ml    PHYSICAL EXAMINATION:  GENERAL:  63 y.o.-year-old patient lying in the bed with no acute distress.  EYES: Pupils equal, round, reactive to light and accommodation. No scleral icterus. Extraocular muscles intact.  HEENT: Head atraumatic, normocephalic. Oropharynx and nasopharynx clear.  NECK:  Supple, no jugular venous distention. No thyroid enlargement, no tenderness.  LUNGS: Normal breath sounds bilaterally, no wheezing, rales,rhonchi or crepitation. No use of accessory muscles of respiration.  CARDIOVASCULAR: S1, S2  normal. No murmurs, rubs, or gallops.  ABDOMEN: Soft, non-tender, non-distended. Bowel sounds present. No organomegaly or mass.  EXTREMITIES: No pedal edema, cyanosis, or clubbing.  NEUROLOGIC: Cranial nerves II through XII are intact. Muscle strength 5/5 in all extremities. Sensation intact. Gait not checked.  PSYCHIATRIC: The patient is alert and oriented x 3.  SKIN: No obvious rash, lesion, or ulcer.   DATA REVIEW:   CBC Recent Labs  Lab 01/26/18 0937  WBC 7.5  HGB 15.7  HCT 45.8  PLT 207    Chemistries  Recent Labs  Lab 01/26/18 0937  NA 136  K 3.9  CL 103  CO2 25  GLUCOSE 93  BUN 13  CREATININE 0.83  CALCIUM 9.8  AST 34  ALT 40  ALKPHOS 55  BILITOT 0.7    Cardiac Enzymes Recent Labs  Lab 01/26/18 2157  TROPONINI <0.03    Microbiology Results  No results found for this or any previous visit.  RADIOLOGY:  Ct Angio Head  W Or Wo Contrast  Result Date: 01/26/2018 CLINICAL DATA:  Left visual field defect for a few minutes this morning. EXAM: CT ANGIOGRAPHY HEAD AND NECK TECHNIQUE: Multidetector CT imaging of the head and neck was performed using the standard protocol during bolus administration of intravenous contrast. Multiplanar CT image reconstructions and MIPs were obtained to evaluate the vascular anatomy. Carotid stenosis measurements (when applicable) are obtained utilizing NASCET criteria, using the distal internal carotid diameter as the denominator. CONTRAST:  75mL ISOVUE-370 IOPAMIDOL (ISOVUE-370) INJECTION 76% COMPARISON:  Noncontrast head CT earlier today. FINDINGS: CTA NECK FINDINGS Aortic arch: Normal.  Three vessel branching. Right carotid system: Smooth and widely patent. No atheromatous changes. Vessel tortuosity. Left carotid system: Smooth and widely patent. No atheromatous changes. Vessel tortuosity. Vertebral arteries: No proximal subclavian stenosis or atherosclerosis. Left dominant vertebral artery. No stenosis or beading. Bilateral vertebral  tortuosity within the transverse foramina. Skeleton: Negative Other neck: Left maxillary sinusitis as previously described. Upper chest: Negative Review of the MIP images confirms the above findings CTA HEAD FINDINGS Anterior circulation: No atheromatous changes, branch occlusion, stenosis, or aneurysm. Posterior circulation: Mild left vertebral artery dominance. Vertebral and basilar arteries are smooth and diffusely patent. There is diminished visualization of bilateral PCAs due to venous contamination. Hypoplastic right P1 segment with robust posterior communicating artery. Venous sinuses: Diffusely patent. Anatomic variants: As above Delayed phase: No abnormal intracranial enhancement. Review of the MIP images confirms the above findings IMPRESSION: Negative CTA of the head and neck. No stenosis, atherosclerosis, or embolic source identified. Electronically Signed   By: Marnee Spring M.D.   On: 01/26/2018 12:49   Dg Chest 2 View  Result Date: 01/26/2018 CLINICAL DATA:  Patient reports blurred vision, central CP, SOB and headache onset today.Denies fever or cough. Hx HTN. Non-smoker. EXAM: CHEST - 2 VIEW COMPARISON:  09/01/2014 FINDINGS: Lungs are clear. Heart size and mediastinal contours are within normal limits. No effusion.  No pneumothorax. Visualized bones unremarkable. IMPRESSION: No acute cardiopulmonary disease. Electronically Signed   By: Corlis Leak M.D.   On: 01/26/2018 10:30   Ct Head Wo Contrast  Result Date: 01/26/2018 CLINICAL DATA:  Visual loss. Patient woke up with headache and nausea. EXAM: CT HEAD WITHOUT CONTRAST TECHNIQUE: Contiguous axial images were obtained from the base of the skull through the vertex without intravenous contrast. COMPARISON:  None. FINDINGS: Brain: No evidence of acute infarction, hemorrhage, hydrocephalus, extra-axial collection or mass lesion/mass effect. Vascular: No hyperdense vessel or unexpected calcification. Skull: Normal. Negative for fracture or focal  lesion. Sinuses/Orbits: Left maxillary sinusitis with fluid level. The other visualized paranasal sinuses are clear. Negative orbits IMPRESSION: 1. Negative intracranial imaging. 2. Left maxillary sinusitis with acute features. Electronically Signed   By: Marnee Spring M.D.   On: 01/26/2018 10:16   Ct Angio Neck W Or Wo Contrast  Result Date: 01/26/2018 CLINICAL DATA:  Left visual field defect for a few minutes this morning. EXAM: CT ANGIOGRAPHY HEAD AND NECK TECHNIQUE: Multidetector CT imaging of the head and neck was performed using the standard protocol during bolus administration of intravenous contrast. Multiplanar CT image reconstructions and MIPs were obtained to evaluate the vascular anatomy. Carotid stenosis measurements (when applicable) are obtained utilizing NASCET criteria, using the distal internal carotid diameter as the denominator. CONTRAST:  75mL ISOVUE-370 IOPAMIDOL (ISOVUE-370) INJECTION 76% COMPARISON:  Noncontrast head CT earlier today. FINDINGS: CTA NECK FINDINGS Aortic arch: Normal.  Three vessel branching. Right carotid system: Smooth and widely patent. No atheromatous changes. Vessel tortuosity. Left  carotid system: Smooth and widely patent. No atheromatous changes. Vessel tortuosity. Vertebral arteries: No proximal subclavian stenosis or atherosclerosis. Left dominant vertebral artery. No stenosis or beading. Bilateral vertebral tortuosity within the transverse foramina. Skeleton: Negative Other neck: Left maxillary sinusitis as previously described. Upper chest: Negative Review of the MIP images confirms the above findings CTA HEAD FINDINGS Anterior circulation: No atheromatous changes, branch occlusion, stenosis, or aneurysm. Posterior circulation: Mild left vertebral artery dominance. Vertebral and basilar arteries are smooth and diffusely patent. There is diminished visualization of bilateral PCAs due to venous contamination. Hypoplastic right P1 segment with robust posterior  communicating artery. Venous sinuses: Diffusely patent. Anatomic variants: As above Delayed phase: No abnormal intracranial enhancement. Review of the MIP images confirms the above findings IMPRESSION: Negative CTA of the head and neck. No stenosis, atherosclerosis, or embolic source identified. Electronically Signed   By: Marnee Spring M.D.   On: 01/26/2018 12:49   Mr Brain Wo Contrast  Result Date: 01/26/2018 CLINICAL DATA:  Chest pressure, brief visual loss. Assess TIA. History of hypertension, headache. EXAM: MRI HEAD WITHOUT CONTRAST TECHNIQUE: Multiplanar, multiecho pulse sequences of the brain and surrounding structures were obtained without intravenous contrast. COMPARISON:  CT/CTA HEAD January 26, 2018 FINDINGS: INTRACRANIAL CONTENTS: No reduced diffusion to suggest acute ischemia. LEFT frontal lobe rounded susceptibility artifact and mild encephalomalacia corresponding to calcification on today's CT. The ventricles and sulci are normal for patient's age. A few scattered subcentimeter supratentorial white matter FLAIR T2 hyperintensities noted. No suspicious parenchymal signal, masses, mass effect. No abnormal extra-axial fluid collections. No extra-axial masses. VASCULAR: Normal major intracranial vascular flow voids present at skull base. SKULL AND UPPER CERVICAL SPINE: No abnormal sellar expansion. No suspicious calvarial bone marrow signal. Craniocervical junction maintained. SINUSES/ORBITS: Mild LEFT paranasal sinus mucosal thickening with LEFT maxillary sinus air-fluid level. Mastoid air cells are well aerated. Included ocular globes and orbital contents are non-suspicious. OTHER: None. IMPRESSION: 1. No acute intracranial process. 2. 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 3. Mild chronic small vessel ischemic disease. 4. Acute LEFT paranasal sinusitis. Electronically Signed   By: Awilda Metro M.D.   On: 01/26/2018 13:35   Nm Myocar Multi W/spect W/wall  Motion / Ef  Result Date: 01/27/2018  Blood pressure demonstrated a normal response to exercise.  There was no ST segment deviation noted during stress.  The study is normal.  This is a low risk study.  The left ventricular ejection fraction is hyperdynamic (>65%).     EKG:   Orders placed or performed during the hospital encounter of 01/26/18  . EKG 12-Lead  . EKG 12-Lead      Management plans discussed with the patient, family and they are in agreement.  CODE STATUS:     Code Status Orders  (From admission, onward)        Start     Ordered   01/26/18 1147  Full code  Continuous     01/26/18 1149    Code Status History    Date Active Date Inactive Code Status Order ID Comments User Context   This patient has a current code status but no historical code status.      TOTAL TIME TAKING CARE OF THIS PATIENT: 35 minutes.    Altamese Dilling M.D on 01/27/2018 at 3:45 PM  Between 7am to 6pm - Pager - [phone]  After 6pm go to [email or web address] - Social research officer, government  [facility]  Office  [phone]  CC: Primary care physician; [facility]   Note: This dictation was prepared with Dragon dictation along with smaller phrase technology. Any transcriptional errors that result from this process are unintentional.

## 2019-09-17 ENCOUNTER — Other Ambulatory Visit: Payer: Self-pay | Admitting: Pediatrics

## 2019-09-17 DIAGNOSIS — Z1231 Encounter for screening mammogram for malignant neoplasm of breast: Secondary | ICD-10-CM

## 2019-12-10 ENCOUNTER — Ambulatory Visit
Admission: RE | Admit: 2019-12-10 | Discharge: 2019-12-10 | Disposition: A | Discharge status: Home / Self Care | Payer: BC Managed Care – PPO | Source: Ambulatory Visit | Attending: Pediatrics | Admitting: Pediatrics

## 2019-12-10 DIAGNOSIS — Z1231 Encounter for screening mammogram for malignant neoplasm of breast: Secondary | ICD-10-CM | POA: Insufficient documentation

## 2019-12-12 ENCOUNTER — Other Ambulatory Visit: Payer: Self-pay | Admitting: Pediatrics

## 2019-12-12 DIAGNOSIS — N6310 Unspecified lump in the right breast, unspecified quadrant: Secondary | ICD-10-CM

## 2019-12-12 DIAGNOSIS — R928 Other abnormal and inconclusive findings on diagnostic imaging of breast: Secondary | ICD-10-CM

## 2019-12-20 ENCOUNTER — Ambulatory Visit
Admission: RE | Admit: 2019-12-20 | Discharge: 2019-12-20 | Disposition: A | Discharge status: Home / Self Care | Payer: BC Managed Care – PPO | Source: Ambulatory Visit | Attending: Pediatrics | Admitting: Pediatrics

## 2019-12-20 DIAGNOSIS — R928 Other abnormal and inconclusive findings on diagnostic imaging of breast: Secondary | ICD-10-CM | POA: Diagnosis present

## 2019-12-20 DIAGNOSIS — N6310 Unspecified lump in the right breast, unspecified quadrant: Secondary | ICD-10-CM | POA: Insufficient documentation

## 2020-04-01 ENCOUNTER — Other Ambulatory Visit: Payer: Self-pay | Admitting: Physician Assistant

## 2020-04-01 ENCOUNTER — Other Ambulatory Visit (HOSPITAL_COMMUNITY): Payer: Self-pay | Admitting: Physician Assistant

## 2020-04-01 DIAGNOSIS — H9202 Otalgia, left ear: Secondary | ICD-10-CM

## 2020-04-08 ENCOUNTER — Ambulatory Visit
Admission: RE | Admit: 2020-04-08 | Discharge: 2020-04-08 | Disposition: A | Discharge status: Home / Self Care | Payer: BC Managed Care – PPO | Source: Ambulatory Visit | Attending: Physician Assistant | Admitting: Physician Assistant

## 2020-04-08 ENCOUNTER — Other Ambulatory Visit: Payer: Self-pay

## 2020-04-08 DIAGNOSIS — H9202 Otalgia, left ear: Secondary | ICD-10-CM | POA: Diagnosis not present

## 2020-04-08 LAB — POCT I-STAT CREATININE: Creatinine, Ser: 1 mg/dL (ref 0.44–1.00)

## 2020-04-08 MED ORDER — IOHEXOL 300 MG/ML  SOLN
75.0000 mL | Freq: Once | INTRAMUSCULAR | Status: AC | PRN
  Administered 2020-04-08: 75 mL via INTRAVENOUS

## 2020-09-29 ENCOUNTER — Other Ambulatory Visit: Payer: Self-pay | Admitting: Family Medicine

## 2020-09-29 DIAGNOSIS — N644 Mastodynia: Secondary | ICD-10-CM

## 2020-09-29 DIAGNOSIS — N63 Unspecified lump in unspecified breast: Secondary | ICD-10-CM

## 2020-10-14 ENCOUNTER — Other Ambulatory Visit: Payer: Self-pay

## 2020-10-14 ENCOUNTER — Ambulatory Visit
Admission: RE | Admit: 2020-10-14 | Discharge: 2020-10-14 | Disposition: A | Discharge status: Home / Self Care | Payer: BC Managed Care – PPO | Source: Ambulatory Visit | Attending: Family Medicine | Admitting: Family Medicine

## 2020-10-14 DIAGNOSIS — N63 Unspecified lump in unspecified breast: Secondary | ICD-10-CM | POA: Diagnosis present

## 2020-10-14 DIAGNOSIS — N644 Mastodynia: Secondary | ICD-10-CM | POA: Diagnosis present

## 2020-10-14 IMAGING — US US BREAST*L* LIMITED INC AXILLA
1 series · 5 of 5 positions shown · non-contrast
Comparison: Previous exam(s).

CLINICAL DATA: Patient complains of palpable abnormalities in the
12 o'clock region of the left breast and in the 9 o'clock, 11
o'clock, 12 o'clock and 3 o'clock regions of the right breast.

EXAM:
DIGITAL DIAGNOSTIC BILATERAL MAMMOGRAM WITH CAD AND TOMO
ULTRASOUND BILATERAL BREAST

[Series 1: us breast*left* limited inc axilla · 0.06mm/px · 5 of 5 slices shown]
[im 1/5]
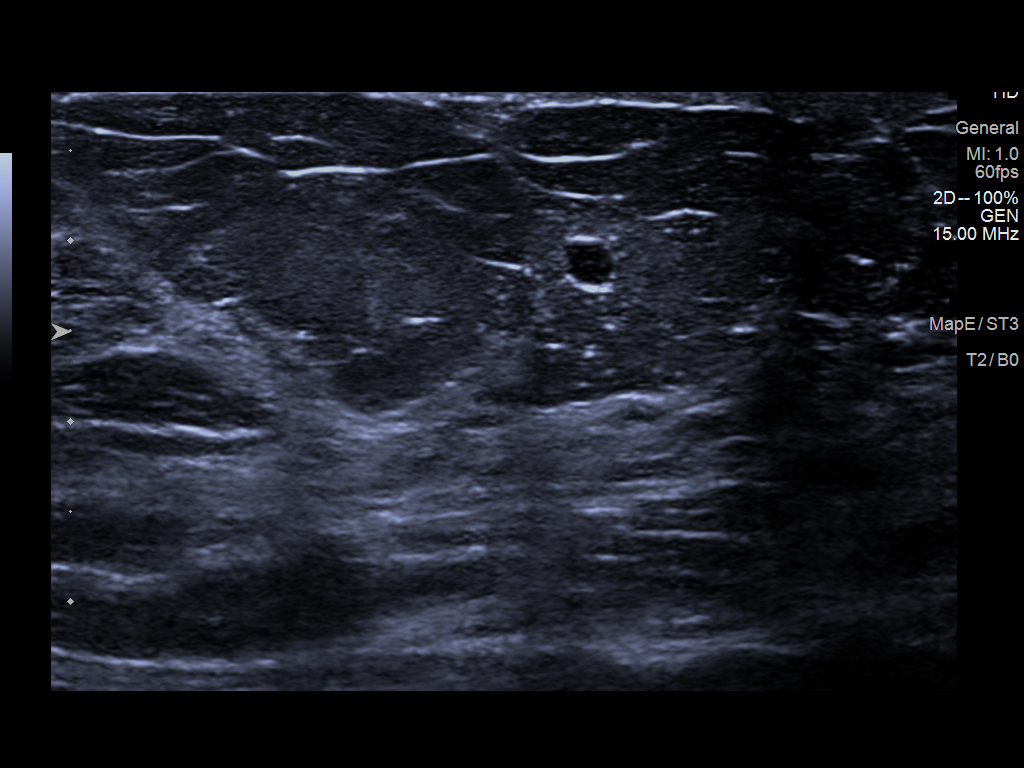
[im 2/5]
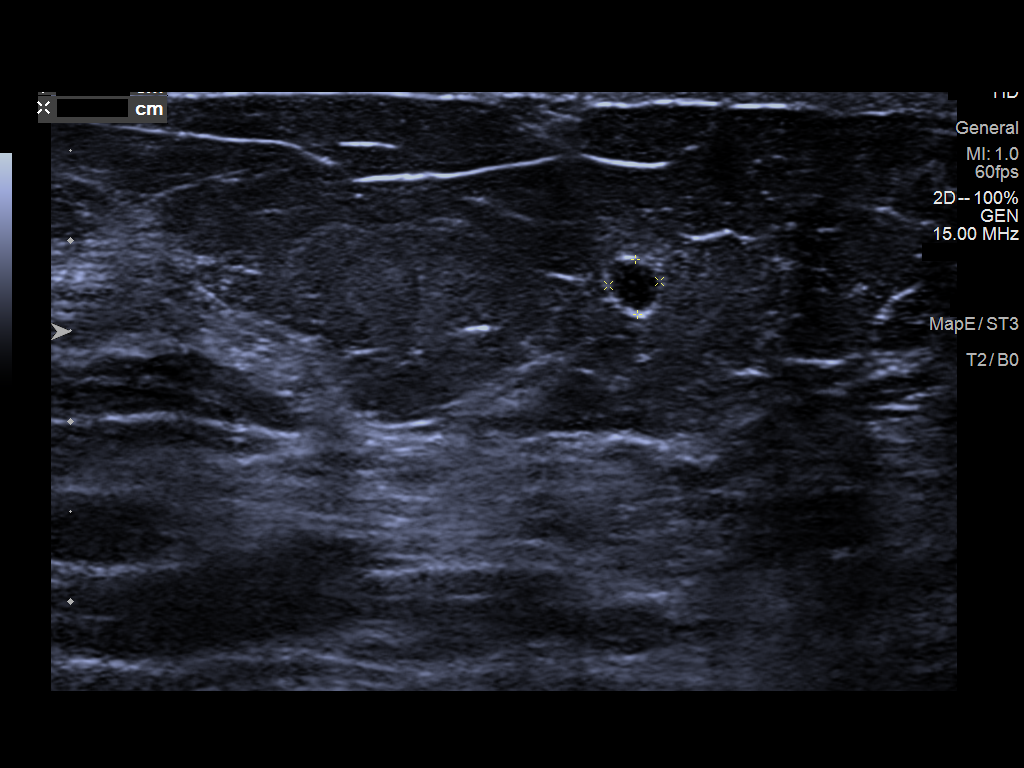
[im 3/5]
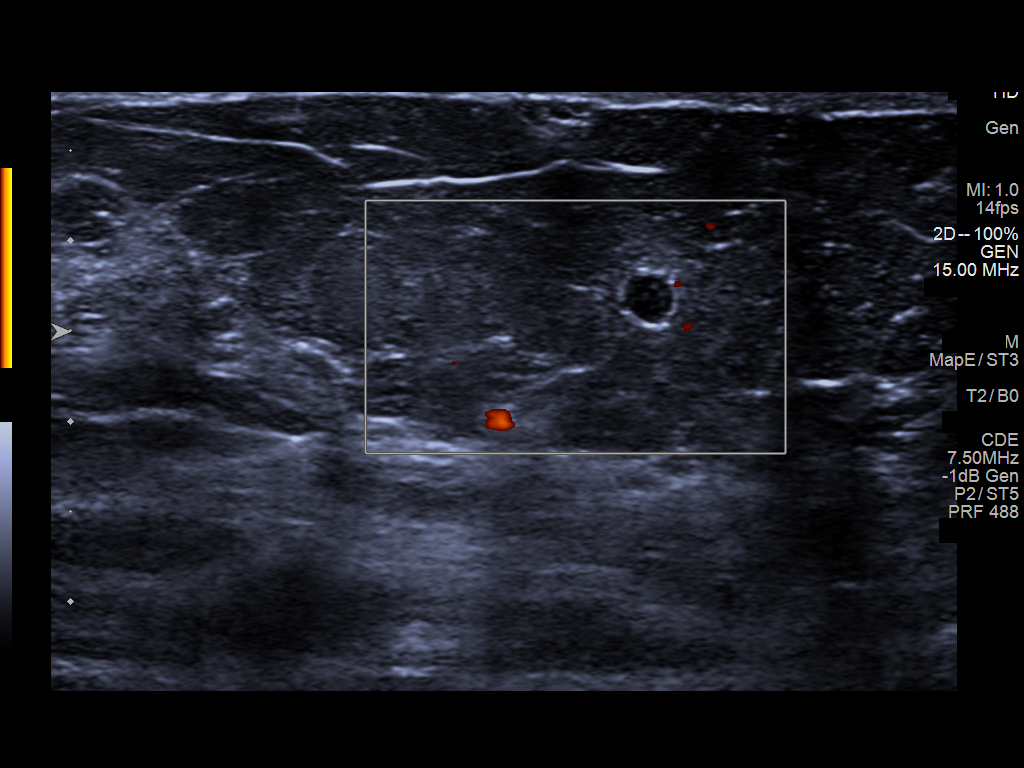
[im 4/5]
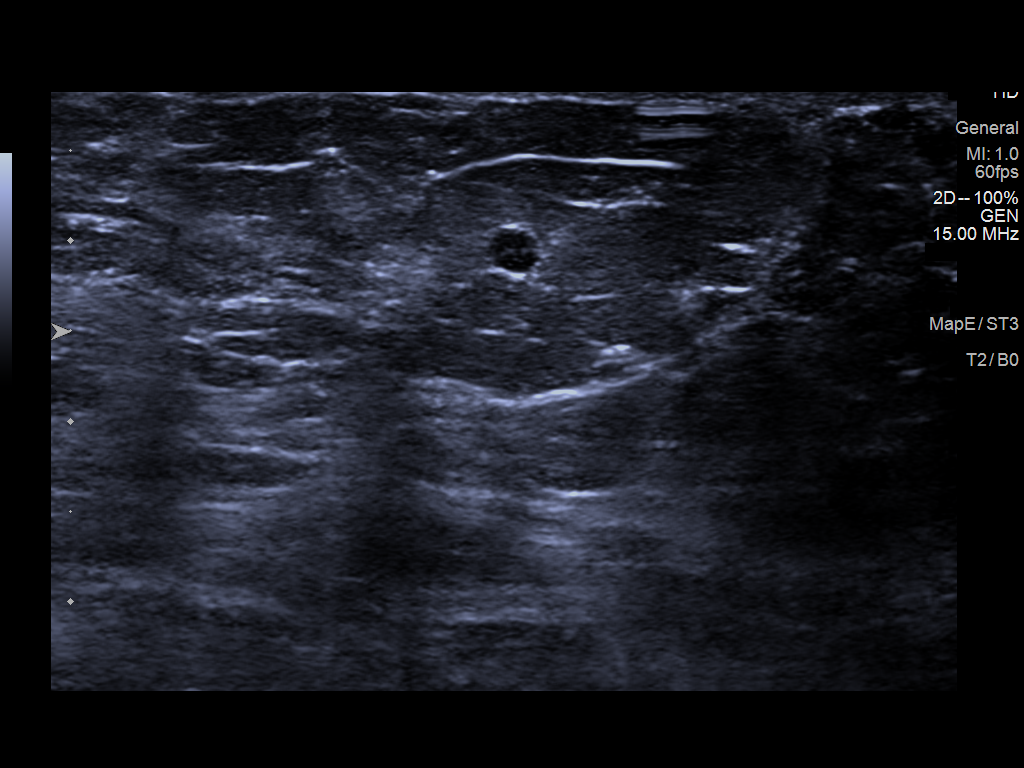
[im 5/5]
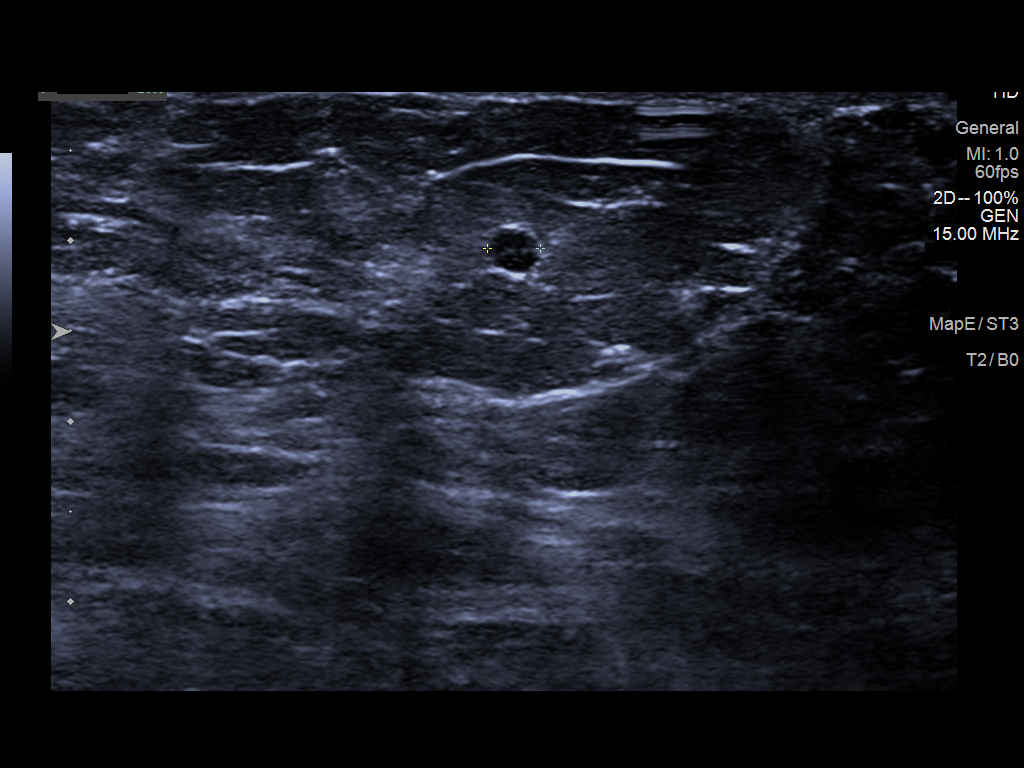

[5 of 5 positions shown; findings below may reference images not displayed]

ACR Breast Density Category b: There are scattered areas of
fibroglandular density.
FINDINGS: There is a stable mass in the 12 o'clock region of the right breast
that has previously been ultrasounded and found to be a cyst. No
suspicious mass or malignant type microcalcifications identified in
either breast.

Mammographic images were processed with CAD.

On physical exam, I do not palpate a discrete mass in the 12 o'clock
region of the left breast or the 9 o'clock, 11 o'clock, 12 o'clock
or 3 o'clock regions of the right breast.

Targeted ultrasound is performed, showing a near anechoic cyst in
the right breast at 12 o'clock 1 cm from the nipple measuring 6 x 7
x 7 mm. On the prior ultrasound dated [DATE] it measured 9 x 8 x
8 mm. Normal tissue is seen in the right breast at 9 o'clock, 11
o'clock and 3 o'clock. There is a well-circumscribed hypoechoic mass
in the left breast at 12 o'clock 1 cm from the nipple measuring 3 x
3 x 3 mm. It is likely a complex cyst.
IMPRESSION: No evidence of malignancy in the right breast. Probable benign cyst
in the 12 o'clock region of the left breast.

RECOMMENDATION:
Short-term interval follow-up left breast ultrasound in 6 months is
recommended.

I have discussed the findings and recommendations with the patient.
If applicable, a reminder letter will be sent to the patient
regarding the next appointment.

BI-RADS CATEGORY  3: Probably benign.

## 2020-10-15 ENCOUNTER — Other Ambulatory Visit: Payer: Self-pay | Admitting: Family Medicine

## 2020-10-15 DIAGNOSIS — N6002 Solitary cyst of left breast: Secondary | ICD-10-CM

## 2020-10-15 DIAGNOSIS — R928 Other abnormal and inconclusive findings on diagnostic imaging of breast: Secondary | ICD-10-CM

## 2021-05-05 ENCOUNTER — Ambulatory Visit
Admission: RE | Admit: 2021-05-05 | Discharge: 2021-05-05 | Disposition: A | Discharge status: Home / Self Care | Payer: BC Managed Care – PPO | Source: Ambulatory Visit | Attending: Family Medicine | Admitting: Family Medicine

## 2021-05-05 ENCOUNTER — Other Ambulatory Visit: Payer: Self-pay

## 2021-05-05 DIAGNOSIS — R928 Other abnormal and inconclusive findings on diagnostic imaging of breast: Secondary | ICD-10-CM | POA: Insufficient documentation

## 2021-05-05 DIAGNOSIS — N6002 Solitary cyst of left breast: Secondary | ICD-10-CM | POA: Insufficient documentation

## 2021-05-11 ENCOUNTER — Other Ambulatory Visit: Payer: Self-pay | Admitting: Family Medicine

## 2021-05-11 DIAGNOSIS — N6320 Unspecified lump in the left breast, unspecified quadrant: Secondary | ICD-10-CM

## 2021-05-11 DIAGNOSIS — Z1231 Encounter for screening mammogram for malignant neoplasm of breast: Secondary | ICD-10-CM

## 2021-10-19 ENCOUNTER — Ambulatory Visit
Admission: RE | Admit: 2021-10-19 | Discharge: 2021-10-19 | Disposition: A | Discharge status: Home / Self Care | Payer: BC Managed Care – PPO | Source: Ambulatory Visit | Attending: Family Medicine | Admitting: Family Medicine

## 2021-10-19 ENCOUNTER — Other Ambulatory Visit: Payer: Self-pay

## 2021-10-19 DIAGNOSIS — Z1231 Encounter for screening mammogram for malignant neoplasm of breast: Secondary | ICD-10-CM | POA: Insufficient documentation

## 2021-10-19 DIAGNOSIS — N6320 Unspecified lump in the left breast, unspecified quadrant: Secondary | ICD-10-CM

## 2022-02-23 ENCOUNTER — Emergency Department
Admission: EM | Admit: 2022-02-23 | Discharge: 2022-02-23 | Disposition: A | Discharge status: Home / Self Care | Payer: BC Managed Care – PPO | Attending: Emergency Medicine | Admitting: Emergency Medicine

## 2022-02-23 ENCOUNTER — Other Ambulatory Visit: Payer: Self-pay

## 2022-02-23 ENCOUNTER — Emergency Department: Payer: BC Managed Care – PPO

## 2022-02-23 DIAGNOSIS — R110 Nausea: Secondary | ICD-10-CM | POA: Diagnosis not present

## 2022-02-23 DIAGNOSIS — R202 Paresthesia of skin: Secondary | ICD-10-CM | POA: Insufficient documentation

## 2022-02-23 DIAGNOSIS — I10 Essential (primary) hypertension: Secondary | ICD-10-CM | POA: Insufficient documentation

## 2022-02-23 DIAGNOSIS — R519 Headache, unspecified: Secondary | ICD-10-CM | POA: Diagnosis present

## 2022-02-23 DIAGNOSIS — R42 Dizziness and giddiness: Secondary | ICD-10-CM | POA: Diagnosis not present

## 2022-02-23 DIAGNOSIS — R0789 Other chest pain: Secondary | ICD-10-CM | POA: Insufficient documentation

## 2022-02-23 LAB — URINALYSIS, ROUTINE W REFLEX MICROSCOPIC
Bacteria, UA: NONE SEEN
Bilirubin Urine: NEGATIVE
Glucose, UA: NEGATIVE mg/dL
Ketones, ur: NEGATIVE mg/dL
Leukocytes,Ua: NEGATIVE
Nitrite: NEGATIVE
Protein, ur: NEGATIVE mg/dL
Specific Gravity, Urine: 1.013 (ref 1.005–1.030)
WBC, UA: NONE SEEN WBC/hpf (ref 0–5)
pH: 6 (ref 5.0–8.0)

## 2022-02-23 LAB — CBC
HCT: 47 % — ABNORMAL HIGH (ref 36.0–46.0)
Hemoglobin: 15.7 g/dL — ABNORMAL HIGH (ref 12.0–15.0)
MCH: 29.8 pg (ref 26.0–34.0)
MCHC: 33.4 g/dL (ref 30.0–36.0)
MCV: 89.4 fL (ref 80.0–100.0)
Platelets: 188 10*3/uL (ref 150–400)
RBC: 5.26 MIL/uL — ABNORMAL HIGH (ref 3.87–5.11)
RDW: 12.4 % (ref 11.5–15.5)
WBC: 6.3 10*3/uL (ref 4.0–10.5)
nRBC: 0 % (ref 0.0–0.2)

## 2022-02-23 LAB — TROPONIN I (HIGH SENSITIVITY): Troponin I (High Sensitivity): 5 ng/L (ref <18)

## 2022-02-23 LAB — BASIC METABOLIC PANEL
Anion gap: 6 (ref 5–15)
BUN: 16 mg/dL (ref 8–23)
CO2: 26 mmol/L (ref 22–32)
Calcium: 9.9 mg/dL (ref 8.9–10.3)
Chloride: 104 mmol/L (ref 98–111)
Creatinine, Ser: 0.79 mg/dL (ref 0.44–1.00)
GFR, Estimated: >60 mL/min (ref >60)
Glucose, Bld: 95 mg/dL (ref 70–99)
Potassium: 4.4 mmol/L (ref 3.5–5.1)
Sodium: 136 mmol/L (ref 135–145)

## 2022-02-23 MED ORDER — GADOBUTROL 1 MMOL/ML IV SOLN
7.5000 mL | Freq: Once | INTRAVENOUS | Status: AC | PRN
  Administered 2022-02-23: 7.5 mL via INTRAVENOUS
  Filled 2022-02-23: qty 7.5

## 2022-02-23 MED ORDER — PROCHLORPERAZINE EDISYLATE 10 MG/2ML IJ SOLN
10.0000 mg | INTRAMUSCULAR | Status: AC
  Administered 2022-02-23: 10 mg via INTRAVENOUS
  Filled 2022-02-23: qty 2

## 2022-02-23 NOTE — ED Notes (Signed)
Pt to ED for dizziness since yesterday. Complains of HA 7/10 since yesterday. Seen by provider already and screened for MRI. ?

## 2022-02-23 NOTE — ED Provider Notes (Signed)
? ?[facility] ?Provider Note ? ? ? Event Date/Time  ? First MD Initiated Contact with Patient 02/23/22 1208   ?  (approximate) ? ? ?History  ? ?Dizziness ? ? ?HPI ? ?Tonya Hopkins is a 66 y.o. female with a past medical history of arthritis, anxiety, recurrent UTIs and TIA who presents for evaluation of headache, dizziness, pressure in her ears and some nausea and chest tightness.  She states she also noted some tingling in her upper lip this morning.  Patient states her symptoms started yesterday with/while she was walking at [employer].  States the chest tightness came on last night as did the headache.  She tried naproxen but this did not help much.  She states she had some nausea this morning but is not actually vomited.  No vision changes, neck pain, back pain, abdominal pain, vomiting, diarrhea, urinary symptoms rash or extremity pain.  No focal weakness numbness or tingling in the extremities.  No EtOH use illicit drug use.  She does note her blood pressure was elevated last night.  She is not currently on antihypertensives. ? ?  ?Past Medical History:  ?Diagnosis Date  ? Anxiety   ? Headache, frequent episodic tension-type   ? Hypertension   ? without treatment  ? OA (osteoarthritis) of hip   ? Recurrent urinary tract infection   ? TIA (transient ischemic attack) 2017  ? ? ? ?Physical Exam  ?Triage Vital Signs: ?ED Triage Vitals  ?Enc Vitals Group  ?   BP 02/23/22 0949 (!) 173/84  ?   Pulse Rate 02/23/22 0949 68  ?   Resp 02/23/22 0949 16  ?   Temp 02/23/22 0949 98.1 ?F (36.7 ?C)  ?   Temp Source 02/23/22 0949 Oral  ?   SpO2 02/23/22 0949 94 %  ?   Weight 02/23/22 0949 179 lb (81.2 kg)  ?   Height --   ?   Head Circumference --   ?   Peak Flow --   ?   Pain Score 02/23/22 0949 6  ?   Pain Loc --   ?   Pain Edu? --   ?   Excl. in GC? --   ? ? ?Most recent vital signs: ?Vitals:  ? 02/23/22 1400 02/23/22 1545  ?BP: 135/76   ?Pulse: 81 67  ?Resp: 16   ?Temp:    ?SpO2: 97% 94%   ? ? ?General: Awake, no distress.  ?CV:  Good peripheral perfusion.  2+ radial pulses.  No murmur. ?Resp:  Normal effort.  Clear bilaterally. ?Abd:  No distention.  Soft. ?Other:  TMs unremarkable bilaterally.  Patient has full range of motion of her neck.  Oropharynx is unremarkable. ? ?Cranial nerves II through XII grossly intact.  No pronator drift.  No finger dysmetria.  Symmetric 5/5 strength of all extremities.  Sensation intact to light touch in all extremities.   ? ? ?ED Results / Procedures / Treatments  ?Labs ?(all labs ordered are listed, but only abnormal results are displayed) ?Labs Reviewed  ?CBC - Abnormal; Notable for the following components:  ?    Result Value  ? RBC 5.26 (*)   ? Hemoglobin 15.7 (*)   ? HCT 47.0 (*)   ? All other components within normal limits  ?URINALYSIS, ROUTINE W REFLEX MICROSCOPIC - Abnormal; Notable for the following components:  ? Color, Urine YELLOW (*)   ? APPearance CLEAR (*)   ? Hgb urine dipstick SMALL (*)   ?  All other components within normal limits  ?BASIC METABOLIC PANEL  ?CBG MONITORING, ED  ?TROPONIN I (HIGH SENSITIVITY)  ? ? ? ?EKG ? ?ECG is remarkable sinus rhythm with a ventricular rate of 66, PACs, nonspecific ST change in lead III without other clear evidence of acute ischemia or significant arrhythmia although there is fairly low amplitude in V3. ? ? ?RADIOLOGY ? ?MRI brain as well as MRA head and neck interpreted by myself without evidence of ischemia, hemorrhage, mass effect or large vessel occlusion.  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 ? ? ?PROCEDURES: ? ?Critical Care performed: No ? ?Procedures ? ? ? ?MEDICATIONS ORDERED IN ED: ?Medications  ?prochlorperazine (COMPAZINE) injection 10 mg (10 mg Intravenous Given 02/23/22 1251)  ?gadobutrol (GADAVIST) 1 MMOL/ML injection 7.5 mL (7.5 mLs Intravenous  Contrast Given 02/23/22 1525)  ? ? ? ?IMPRESSION / MDM / ASSESSMENT AND PLAN / ED COURSE  ?I reviewed the triage vital signs and the nursing notes. ?             ?               ? ?Differential diagnosis includes, but is not limited to migraine, symptomatic hypertension, viral URI, sinusitis, CVA, ACS, anemia, arrhythmia and metabolic derangements.  Patient has no respiratory symptoms or abnormal breath sounds to suggest a pneumonia.  There is no evidence of a deep space infection in the head or neck.  No history of trauma or medication change. ? ?EKG and nonelevated troponin obtained greater than 3 hours after symptom onset is not suggestive of ACS or myocarditis.  No significant arrhythmia identified. ? ?UA has small and hemoglobin but no evidence of infection.  BMP without any significant electrolyte or metabolic derangements.  CBC without leukocytosis or acute anemia. ? ?MRI brain as well as MRA head and neck interpreted by myself without evidence of ischemia, hemorrhage, mass effect or large vessel occlusion.  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 ? ?Unclear if symptoms could be related to high blood pressure reported by patient last night although she states her headaches resolved on my reassessment.  I considered observation given stable vitals with normal neurological exam and reassuring MR studies as well as low suspicion for other immediate life-threatening process I think she is stable for discharge with outpatient follow-up.  Advised to have her blood pressure rechecked by PCP in the follow-up for today's ED visit with PCP.  Discharged in stable condition.  Strict and precautions advised and discussed. ? ?  ? ? ?FINAL CLINICAL IMPRESSION(S) / ED DIAGNOSES  ? ?Final diagnoses:  ?Dizziness  ?Tingling  ?Chest tightness  ?Acute nonintractable headache,  unspecified headache type  ? ? ? ?Rx / DC Orders  ? ?ED Discharge Orders   ? ? None  ? ?  ? ? ? ?Note:  This document was prepared using Dragon voice recognition software and may include unintentional dictation errors. ?  ?Gilles Chiquito, MD ?02/23/22 1553 ? ?

## 2022-02-23 NOTE — ED Triage Notes (Signed)
Pt comes pov with dizziness like room spinning since yesterday around 1pm. Pt also reports tingling in her top lip since same time. States it feels like her heart has been racing. HX of TIA in the past. BP has been running 160s/100s. Denies other tingling and weakness but states she generally hasn't been feeling good for the past couple of weeks.  ?

## 2022-04-07 ENCOUNTER — Other Ambulatory Visit: Payer: Self-pay | Admitting: Pediatrics

## 2022-04-11 ENCOUNTER — Other Ambulatory Visit: Payer: Self-pay | Admitting: Pediatrics

## 2022-04-11 DIAGNOSIS — Z780 Asymptomatic menopausal state: Secondary | ICD-10-CM

## 2022-05-17 ENCOUNTER — Ambulatory Visit
Admission: RE | Admit: 2022-05-17 | Discharge: 2022-05-17 | Disposition: A | Discharge status: Home / Self Care | Payer: BC Managed Care – PPO | Source: Ambulatory Visit | Attending: Pediatrics | Admitting: Pediatrics

## 2022-05-17 DIAGNOSIS — Z780 Asymptomatic menopausal state: Secondary | ICD-10-CM | POA: Insufficient documentation

## 2023-05-09 ENCOUNTER — Other Ambulatory Visit: Payer: Self-pay | Admitting: Pediatrics

## 2023-05-09 DIAGNOSIS — N644 Mastodynia: Secondary | ICD-10-CM

## 2023-05-29 ENCOUNTER — Other Ambulatory Visit: Payer: Self-pay | Admitting: Pediatrics

## 2023-05-29 ENCOUNTER — Ambulatory Visit
Admission: RE | Admit: 2023-05-29 | Discharge: 2023-05-29 | Disposition: A | Discharge status: Home / Self Care | Payer: Medicare Other | Source: Ambulatory Visit | Attending: Pediatrics | Admitting: Pediatrics

## 2023-05-29 DIAGNOSIS — N644 Mastodynia: Secondary | ICD-10-CM

## 2023-05-29 DIAGNOSIS — N6320 Unspecified lump in the left breast, unspecified quadrant: Secondary | ICD-10-CM | POA: Diagnosis present

## 2024-06-25 ENCOUNTER — Other Ambulatory Visit: Payer: Self-pay | Admitting: Pediatrics

## 2024-06-25 DIAGNOSIS — Z1231 Encounter for screening mammogram for malignant neoplasm of breast: Secondary | ICD-10-CM

## 2024-06-28 ENCOUNTER — Ambulatory Visit
Admission: RE | Admit: 2024-06-28 | Discharge: 2024-06-28 | Disposition: A | Discharge status: Home / Self Care | Source: Ambulatory Visit | Attending: Pediatrics | Admitting: Pediatrics

## 2024-06-28 DIAGNOSIS — Z1231 Encounter for screening mammogram for malignant neoplasm of breast: Secondary | ICD-10-CM | POA: Diagnosis present
